# Patient Record
Sex: Male | Born: 1970 | Race: White | Hispanic: No | Marital: Married | Smoking: Current every day smoker
Health system: Southern US, Community
[De-identification: ages and names within clinical notes are randomized; demographics above are authoritative.]

## PROBLEM LIST (undated history)

## (undated) DIAGNOSIS — G4730 Sleep apnea, unspecified: Secondary | ICD-10-CM

## (undated) DIAGNOSIS — I10 Essential (primary) hypertension: Secondary | ICD-10-CM

## (undated) DIAGNOSIS — E785 Hyperlipidemia, unspecified: Secondary | ICD-10-CM

## (undated) DIAGNOSIS — R011 Cardiac murmur, unspecified: Secondary | ICD-10-CM

## (undated) DIAGNOSIS — M1990 Unspecified osteoarthritis, unspecified site: Secondary | ICD-10-CM

## (undated) DIAGNOSIS — K7460 Unspecified cirrhosis of liver: Secondary | ICD-10-CM

## (undated) HISTORY — PX: APPENDECTOMY: SHX54

## (undated) HISTORY — PX: ANTERIOR CRUCIATE LIGAMENT REPAIR: SHX115

## (undated) HISTORY — DX: Hyperlipidemia, unspecified: E78.5

## (undated) HISTORY — DX: Unspecified cirrhosis of liver: K74.60

## (undated) HISTORY — PX: UPPER ENDOSCOPY W/ BANDING: SHX2603

## (undated) HISTORY — PX: KNEE SURGERY: SHX244

## (undated) HISTORY — PX: CHOLECYSTECTOMY: SHX55

---

## 2006-12-17 ENCOUNTER — Ambulatory Visit (HOSPITAL_BASED_OUTPATIENT_CLINIC_OR_DEPARTMENT_OTHER): Admission: RE | Admit: 2006-12-17 | Discharge: 2006-12-17 | Payer: Self-pay | Admitting: Orthopedic Surgery

## 2010-07-10 NOTE — Op Note (Signed)
NAMEOBRIEN, Ray Lewis             ACCOUNT NO.:  [id number]   MEDICAL RECORD NO.:  [id number]          PATIENT TYPE:  AMB   LOCATION:  DSC                          FACILITY:  [facility]   PHYSICIAN:  Feliberto Gottron. Turner Daniels, M.D.   DATE OF BIRTH:  February 20, 1971   DATE OF PROCEDURE:  12/17/2006  DATE OF DISCHARGE:                               OPERATIVE REPORT   PREOPERATIVE DIAGNOSIS:  Right knee chronic ACL tear, acute medial and  lateral meniscal tears.   POSTOPERATIVE DIAGNOSIS:  Right knee chronic ACL tear, acute medial and  lateral meniscal tears.   PROCEDURE:  Right knee arthroscopic debridement of posterior horn medial  meniscal tear posterior horn lateral meniscal tear as well as  chondromalacia of the lateral femoral condyle, focal grade 4 over a 1.5  x 1.5 cm area.  We also performed an endoscopic redo ACL reconstruction  using an allograft, 10 mm wide bone plugs and with an 8 x 20 femoral  screw and a 9 x 25 tibial interference screw.  Both of them Linvatec  Propel titanium screws.   SURGEON:  Feliberto Gottron. Turner Daniels, M.D.   FIRST ASSISTANT:  Skip Mayer PA-C.   ANESTHETIC:  General endotracheal.   ESTIMATED BLOOD LOSS:  Minimal.   FLUID REPLACEMENT:  Liter crystalloid.   DRAINS PLACED:  None.   TOURNIQUET TIME:  None.   INDICATIONS FOR PROCEDURE:  40 year old man who had an ACL  reconstruction done about 10 years ago using an allograft with  bioabsorbable screws.  The graft failed.  He has had some knee  instability and recently was injured at work and sustained a medial  lateral meniscal tears and some possible chondromalacia.  Workers' comp  is going to be covering the meniscal tears and chondromalacia.  The ACL  reconstruction will be covered by his private insurance carrier, I  believe.  The risks and benefits of surgery discussed, questions  answered.  The goal was to stabilize his knee and remove any torn bits  of cartilage that may be interfering with motion in his right  knee.   DESCRIPTION OF PROCEDURE:  The patient identified by armband, taken the  operating room at [facility] day surgery center.  Appropriate anesthetic  monitors were attached and general endotracheal anesthesia induced with  the patient supine position.  Lateral post applied to the table as well  as a Stulberg Mark II foot positioner and the right lower extremity  prepped, draped in usual sterile fashion from the ankle to the midthigh.  Using a #11 blade standard inferomedial and inferolateral peripatellar  portals were then made allowing introduction of the arthroscope through  the inferolateral portal and outflow through the inferomedial portal.  Diagnostic arthroscopy revealed a normal patella and a normal trochlea.  Going to the medial side posterior horn medial meniscal tear was  identified and removed using straight biters, up biters and a 3.5 gator  sucker shaver.  Moving through the notch we documented the ACL being  completely torn and pretty much absent, consistent with it not being  there for at least a few years.  On the lateral  side posterior horn tear  lateral meniscus was identified debrided back to stable margin.  We was  also documented a 1.5 cm section of completely missing cartilage from  the distal and posterior aspects of the lateral femoral condyle.  This  was later drilled with the trephine to bring in a blood supply and scar  cartilage.  The knee was then positioned in 45 degrees flexion in the  Stulberg foot positioner and we performed a superior lateral notchplasty  removing some pretty impressive notch osteophytes with a 4.5 hooded  vortex bur.  We then brought in the Linvatec tibial pin positioning  guide set at 55 degrees, placed the tip of the guide on the posterior  aspect of the ACL footprint and then externally with the guide set at 55  degrees, made a 1.5 cm incision about a centimeter and half medial to  and just distal to the tip of the tibial tubercle.   Through this  incision we passed a guide pin through the flare of the metaphysis of  the tibia and up into the posterior aspect of the ACL footprint and over  reamed it with a 10 mm C reamer from the Linvatec ACL set.  At the back  table a 10 mm wide bone-tendon-bone allograft was fashioned and in the  tibial bone plug a 20-gauge steel wire was placed for traction.  The  patellar bone plug was a 10 mm x 23 mm in length.  Through the tibial  tunnel, we then placed a 7-mm over-the-top guide in the 10:30 position  on the posterior superior aspect of the lateral femoral notch and then  drilled our second guide pin up into the lateral femoral condyle over  reamed this with a 10 mm reamer and unfortunately obtained a good tight  tunnel with no posterior blowout.  Satisfied with the position of the  tunnels, a superior notch was placed in the femoral tunnel lateral notch  in the tibial tunnel with notch cutting guide.  We then took our  allograft and threaded up through the tibial tunnel with the patellar  bone plug going first running the patellar bone plug up into the femoral  tunnel using guide pins as pushers.  A supplemental inferomedial portal  was then made and a nitinol wire threaded up into the notch between the  femoral tunnel and the patellar bone plug.  The knee was hyperflexed to  about 105 degrees and an 8 x 20 Linvatec Propel titanium screw was used  to lock the patellar bone plug into the femoral tunnel and squeaking of  the screw was noted obtaining good firm fixation.  We applied traction  with the 20 gauge wire confirming good fixation of the patellar bone  plug.  The knee was then extended to 45 degrees flexion.  The graft was  rotated to 180 degrees distally using the 20-gauge steel wire and at 45  degrees flexion with a reverse Lachman's maneuver, the graft was locked  into place with a 9 x 25 Linvatec Propel titanium screw obtaining good  firm fixation with a squeak.  Graft  tension was then checked with a  probe.  The knee was flexed to 120 degrees.  We checked tension.  We  brought the knee out to a 0, checked tension again and good tension was  noted and a negative Lachman's test. We did touch up the superior aspect  of the notch plasty a little bit to avoid any impingement, satisfied  with the position of the graft the traction wire was cut and removed and  then using a trephine through the skin with the knee flexed, we  trephined the lateral femoral condyle with a 0.625 K-wire to bring in  some scar cartilage.  At this point the arthroscope was removed.  The  entry point for the graft which was about 1.5 cm in length was closed  with 3-0 Vicryl suture and 4-0 Monocryl suture.  Subcuticular and a  dressing of Xeroform, 4x4 dressing sponges, Webril and Ace wrap applied.  The patient was then awakened and taken to the recovery room without  difficulty and should be noted he did receive a gram of Ancef  preoperatively.      Feliberto Gottron. Turner Daniels, M.D.  Electronically Signed     FJR/MEDQ  D:  12/17/2006  T:  12/18/2006  Job:  [id number]

## 2016-12-03 DIAGNOSIS — Z0000 Encounter for general adult medical examination without abnormal findings: Secondary | ICD-10-CM | POA: Diagnosis not present

## 2017-12-12 DIAGNOSIS — Z6833 Body mass index (BMI) 33.0-33.9, adult: Secondary | ICD-10-CM | POA: Diagnosis not present

## 2017-12-12 DIAGNOSIS — Z0000 Encounter for general adult medical examination without abnormal findings: Secondary | ICD-10-CM | POA: Diagnosis not present

## 2018-03-05 ENCOUNTER — Other Ambulatory Visit: Payer: Self-pay

## 2018-03-05 ENCOUNTER — Emergency Department (HOSPITAL_COMMUNITY)
Admission: EM | Admit: 2018-03-05 | Discharge: 2018-03-05 | Disposition: A | Discharge status: Home / Self Care | Payer: 59 | Attending: Emergency Medicine | Admitting: Emergency Medicine

## 2018-03-05 ENCOUNTER — Encounter (HOSPITAL_COMMUNITY): Payer: Self-pay | Admitting: Emergency Medicine

## 2018-03-05 ENCOUNTER — Emergency Department (HOSPITAL_COMMUNITY): Payer: 59

## 2018-03-05 DIAGNOSIS — R0789 Other chest pain: Secondary | ICD-10-CM | POA: Diagnosis not present

## 2018-03-05 DIAGNOSIS — R9431 Abnormal electrocardiogram [ECG] [EKG]: Secondary | ICD-10-CM | POA: Insufficient documentation

## 2018-03-05 DIAGNOSIS — Z79899 Other long term (current) drug therapy: Secondary | ICD-10-CM | POA: Insufficient documentation

## 2018-03-05 DIAGNOSIS — R079 Chest pain, unspecified: Secondary | ICD-10-CM | POA: Diagnosis not present

## 2018-03-05 DIAGNOSIS — F17210 Nicotine dependence, cigarettes, uncomplicated: Secondary | ICD-10-CM | POA: Insufficient documentation

## 2018-03-05 HISTORY — DX: Sleep apnea, unspecified: G47.30

## 2018-03-05 HISTORY — DX: Cardiac murmur, unspecified: R01.1

## 2018-03-05 HISTORY — DX: Unspecified osteoarthritis, unspecified site: M19.90

## 2018-03-05 LAB — BASIC METABOLIC PANEL
ANION GAP: 10 (ref 5–15)
BUN: 7 mg/dL (ref 6–20)
CALCIUM: 9.1 mg/dL (ref 8.9–10.3)
CO2: 22 mmol/L (ref 22–32)
Chloride: 101 mmol/L (ref 98–111)
Creatinine, Ser: 0.81 mg/dL (ref 0.61–1.24)
GFR calc non Af Amer: >60 mL/min (ref >60)
Glucose, Bld: 123 mg/dL — ABNORMAL HIGH (ref 70–99)
Potassium: 3.8 mmol/L (ref 3.5–5.1)
SODIUM: 133 mmol/L — AB (ref 135–145)

## 2018-03-05 LAB — CBC
HEMATOCRIT: 45.4 % (ref 39.0–52.0)
Hemoglobin: 16.4 g/dL (ref 13.0–17.0)
MCH: 35.3 pg — ABNORMAL HIGH (ref 26.0–34.0)
MCHC: 36.1 g/dL — ABNORMAL HIGH (ref 30.0–36.0)
MCV: 97.8 fL (ref 80.0–100.0)
NRBC: 0 % (ref 0.0–0.2)
Platelets: 126 10*3/uL — ABNORMAL LOW (ref 150–400)
RBC: 4.64 MIL/uL (ref 4.22–5.81)
RDW: 11.6 % (ref 11.5–15.5)
WBC: 8.3 10*3/uL (ref 4.0–10.5)

## 2018-03-05 LAB — I-STAT TROPONIN, ED
Troponin i, poc: 0 ng/mL (ref 0.00–0.08)
Troponin i, poc: 0 ng/mL (ref 0.00–0.08)

## 2018-03-05 NOTE — ED Notes (Signed)
ED Provider at bedside. 

## 2018-03-05 NOTE — ED Triage Notes (Signed)
Pt with non radiating chest tightness with pain and tingling sensation all day. He reports a new onset of dizziness with nausea. Denies sob. A/o at triage.

## 2018-03-05 NOTE — ED Provider Notes (Signed)
[facility] EMERGENCY DEPARTMENT Provider Note   CSN: [id number] Arrival date & time: 03/05/18  1348   History   Chief Complaint Chief Complaint  Patient presents with  . Chest Pain  . Dizziness    HPI Ray Lewis is a 48 y.o. male HLD, sleep apnea, and heart murmur presents with intermittent non radiating left and right sided chest pain onset 7am today. Patient describes pain as tightness and intermittent sharp pains. Patient reports nothing makes the symptoms better or worse. Patient reports associated nausea, dizziness, and lightheadedness. Patient denies DOE, SOB, chest tightness or pressure, radiation to left arm, jaw or back, or diaphoresis. Patient denies prior evaluation by cardiologist. Patient denies a personal or family history of heart problems. Patient denies recent injury, leg edema/pain, recent travel, recent surgery, or hx of DVT/PE. Patient denies weakness, numbness, or paresthesias. Patient reports daily tobacco use and daily alcohol use, but denies drug use.    HPI  Past Medical History:  Diagnosis Date  . Arthritis   . Murmur   . Sleep apnea     There are no active problems to display for this patient.   History reviewed. No pertinent surgical history.      Home Medications    Prior to Admission medications   Medication Sig Start Date End Date Taking? Authorizing Provider  aspirin EC 81 MG tablet Take 81 mg by mouth See admin instructions. Take 81 mg by mouth two to three times a week   Yes [provider]    Family History No family history on file.  Social History Social History   Tobacco Use  . Smoking status: Current Every Day Smoker    Packs/day: 1.00    Types: Cigarettes  . Smokeless tobacco: Never Used  Substance Use Topics  . Alcohol use: Yes  . Drug use: Not Currently     Allergies   Patient has no known allergies.   Review of Systems Review of Systems  Constitutional: Negative for activity  change, appetite change, chills, diaphoresis, fatigue, fever and unexpected weight change.  HENT: Negative for congestion and rhinorrhea.   Respiratory: Negative for cough, chest tightness, shortness of breath and wheezing.   Cardiovascular: Positive for chest pain. Negative for palpitations and leg swelling.  Gastrointestinal: Negative for abdominal pain, nausea and vomiting.  Endocrine: Negative for cold intolerance and heat intolerance.  Musculoskeletal: Negative for back pain.  Skin: Negative for rash.  Allergic/Immunologic: Negative for immunocompromised state.  Neurological: Positive for dizziness and light-headedness. Negative for syncope, weakness, numbness and headaches.  Psychiatric/Behavioral: Negative for agitation and behavioral problems. The patient is not nervous/anxious.      Physical Exam Updated Vital Signs BP (!) 150/80   Pulse 67   Temp 98.2 F (36.8 C) (Oral)   Resp 15   Ht 5\' 9"  (1.753 m)   Wt 106.6 kg   SpO2 96%   BMI 34.70 kg/m   Physical Exam Vitals signs and nursing note reviewed.  Constitutional:      General: He is not in acute distress.    Appearance: He is well-developed. He is not diaphoretic.  HENT:     Head: Normocephalic and atraumatic.  Neck:     Musculoskeletal: Normal range of motion and neck supple.     Vascular: No JVD.  Cardiovascular:     Rate and Rhythm: Normal rate and regular rhythm.     Pulses: Normal pulses.          Radial  pulses are 2+ on the right side and 2+ on the left side.       Dorsalis pedis pulses are 2+ on the right side and 2+ on the left side.     Heart sounds: Murmur present. No friction rub. No gallop.   Pulmonary:     Effort: Pulmonary effort is normal. No respiratory distress.     Breath sounds: Normal breath sounds. No wheezing, rhonchi or rales.  Chest:     Chest wall: No tenderness.  Abdominal:     Palpations: Abdomen is soft.     Tenderness: There is no abdominal tenderness.  Musculoskeletal: Normal  range of motion.     Right lower leg: He exhibits no tenderness. No edema.     Left lower leg: He exhibits no tenderness. No edema.  Skin:    General: Skin is warm.     Capillary Refill: Capillary refill takes less than 2 seconds.     Coloration: Skin is not pale.     Findings: No rash.  Neurological:     Mental Status: He is alert and oriented to person, place, and time.    Mental Status:  Alert, oriented, thought content appropriate, able to give a coherent history. Speech fluent without evidence of aphasia. Able to follow 2 step commands without difficulty.  Cranial Nerves:  II:  Peripheral visual fields grossly normal, pupils equal, round, reactive to light III,IV, VI: ptosis not present, extra-ocular motions intact bilaterally  V,VII: smile symmetric, facial light touch sensation equal VIII: hearing grossly normal to voice  X: uvula elevates symmetrically  XI: bilateral shoulder shrug symmetric and strong XII: midline tongue extension without fassiculations Motor:  Normal tone. 5/5 in upper and lower extremities bilaterally including strong and equal grip strength and dorsiflexion/plantar flexion Sensory: Pinprick and light touch normal in all extremities.  Deep Tendon Reflexes: 2+ and symmetric in the biceps and patella Cerebellar: normal finger-to-nose with bilateral upper extremities Gait: normal gait and balance.  Negative pronator drift. Negative Romberg sign. CV: distal pulses palpable throughout    ED Treatments / Results  Labs (all labs ordered are listed, but only abnormal results are displayed) Labs Reviewed  BASIC METABOLIC PANEL - Abnormal; Notable for the following components:      Result Value   Sodium 133 (*)    Glucose, Bld 123 (*)    All other components within normal limits  CBC - Abnormal; Notable for the following components:   MCH 35.3 (*)    MCHC 36.1 (*)    Platelets 126 (*)    All other components within normal limits  I-STAT TROPONIN, ED    I-STAT TROPONIN, ED    EKG EKG Interpretation  Date/Time:  Thursday March 05 2018 17:57:24 EST Ventricular Rate:  74 PR Interval:  138 QRS Duration: 96 QT Interval:  414 QTC Calculation: 459 R Axis:   63 Text Interpretation:  Normal sinus rhythm Nonspecific T wave abnormality Abnormal ECG unchanged from earlier today Confirmed by Aletta Edouard [id number]) on 03/05/2018 6:04:32 PM   Radiology Dg Chest 2 View  Result Date: 03/05/2018 CLINICAL DATA:  Chest pain. EXAM: CHEST - 2 VIEW COMPARISON:  None. FINDINGS: The heart size and mediastinal contours are within normal limits. Both lungs are clear. No pneumothorax or pleural effusion is noted. The visualized skeletal structures are unremarkable. IMPRESSION: No active cardiopulmonary disease. Electronically Signed   By: Marijo Conception, M.D.   On: 03/05/2018 14:30    Procedures Procedures (including critical care  time)  Medications Ordered in ED Medications - No data to display   Initial Impression / Assessment and Plan / ED Course  I have reviewed the triage vital signs and the nursing notes.  Pertinent labs & imaging results that were available during my care of the patient were reviewed by me and considered in my medical decision making (see chart for details).  Clinical Course as of Mar 05 1829  Thu Mar 05, 2018  1525 No active cardiopulmonary disease.  DG Chest 2 View [AH]  9381 ST and T wave abnormalities in inferior leads.  ED EKG within 10 minutes [AH]  1738 Troponin x 2 negative.   [AH]  0175 With hypertension hypercholesterolemia here with chest pain.  Michela Pitcher he has had pains before but this 1 felt different and concerned him.  His work-up here has been unremarkable with 2 tropes.  He was offered admission but he felt like he could go home and follow-up outpatient with his doctor and we are giving him also the number for cardiology clinic.  He understands to return if any worsening symptoms.   [MB]    Clinical Course  User Index [AH] Arville Lime, PA-C [MB] Hayden Rasmussen, MD   Concern for cardiac etiology of Chest Pain due to history and abnormal EKG. Heart score is a 4. Presentation of chest pain is PERC negative, VSS, no tracheal deviation, no JVD or new murmur, RRR, breath sounds equal bilaterally, negative troponin x2, and negative CXR. Discussed admission with patient for further evaluation and patient refused to be admitted.   I have discussed my concerns as his provider and the possibility that this may worsen. I have specifically discussed that without further evaluation I cannot guarantee there is not a life threatening event occuring.  Pt is A&Ox4, his own POA and states understanding of my concerns and the possible consequences.  I have made pt aware that he may return at any time for further evaluation and treatment.   Patient is to be discharged with recommendation to follow up with Cardiology and PCP in regards to today's hospital visit.   Case has been discussed with and seen by Dr. Melina Copa who agrees with the above plan to discharge.   Final Clinical Impressions(s) / ED Diagnoses   Final diagnoses:  Atypical chest pain  Abnormal finding on EKG    ED Discharge Orders    None       Arville Lime, [place] 03/05/18 1832    Hayden Rasmussen, MD 03/05/18 2320

## 2018-03-05 NOTE — Discharge Instructions (Addendum)
You have been seen today for chest pain. Please read and follow all provided instructions.   1. Medications: usual home medications 2. Treatment: rest, drink plenty of fluids 3. Follow Up: Please follow up with your primary doctor in 2 days for discussion of your diagnoses and further evaluation after today's visit; if you do not have a primary care doctor use the resource guide provided to find one; Please return to the ER for any new or worsening symptoms. Please obtain all of your results from medical records or have your doctors office obtain the results - share them with your doctor - you should be seen at your doctors office. Call today to arrange your follow up.   Take medications as prescribed. Please review all of the medicines and only take them if you do not have an allergy to them. Return to the emergency room for worsening condition or new concerning symptoms. Follow up with your regular doctor. If you don't have a regular doctor use one of the numbers below to establish a primary care doctor. ?  It is also a possibility that you have an allergic reaction to any of the medicines that you have been prescribed - Everybody reacts differently to medications and while MOST people have no trouble with most medicines, you may have a reaction such as nausea, vomiting, rash, swelling, shortness of breath. If this is the case, please stop taking the medicine immediately and contact your physician.  ?  You should return to the ER if you develop severe or worsening symptoms.   Emergency Department Resource Guide 1) Find a Doctor and Pay Out of Pocket Although you won't have to find out who is covered by your insurance plan, it is a good idea to ask around and get recommendations. You will then need to call the office and see if the doctor you have chosen will accept you as a new patient and what types of options they offer for patients who are self-pay. Some doctors offer discounts or will set up  payment plans for their patients who do not have insurance, but you will need to ask so you aren't surprised when you get to your appointment.  2) Contact Your Local Health Department Not all health departments have doctors that can see patients for sick visits, but many do, so it is worth a call to see if yours does. If you don't know where your local health department is, you can check in your phone book. The CDC also has a tool to help you locate your state's health department, and many state websites also have listings of all of their local health departments.  3) Find a [facility] If your illness is not likely to be very severe or complicated, you may want to try a walk in clinic. These are popping up all over the country in pharmacies, drugstores, and shopping centers. They're usually staffed by nurse practitioners or physician assistants that have been trained to treat common illnesses and complaints. They're usually fairly quick and inexpensive. However, if you have serious medical issues or chronic medical problems, these are probably not your best option.  No Primary Care Doctor: Call Health Connect at  [phone] - they can help you locate a primary care doctor that  accepts your insurance, provides certain services, etc. Physician Referral Service- [phone]  Emergency Department Resource Guide 1) Find a Doctor and Pay Out of Pocket Although you won't have to find out who is covered by your insurance  plan, it is a good idea to ask around and get recommendations. You will then need to call the office and see if the doctor you have chosen will accept you as a new patient and what types of options they offer for patients who are self-pay. Some doctors offer discounts or will set up payment plans for their patients who do not have insurance, but you will need to ask so you aren't surprised when you get to your appointment.  2) Contact Your Local Health Department Not all health  departments have doctors that can see patients for sick visits, but many do, so it is worth a call to see if yours does. If you don't know where your local health department is, you can check in your phone book. The CDC also has a tool to help you locate your state's health department, and many state websites also have listings of all of their local health departments.  3) Find a [facility] If your illness is not likely to be very severe or complicated, you may want to try a walk in clinic. These are popping up all over the country in pharmacies, drugstores, and shopping centers. They're usually staffed by nurse practitioners or physician assistants that have been trained to treat common illnesses and complaints. They're usually fairly quick and inexpensive. However, if you have serious medical issues or chronic medical problems, these are probably not your best option.  No Primary Care Doctor: Call Health Connect at  [phone] - they can help you locate a primary care doctor that  accepts your insurance, provides certain services, etc. Physician Referral Service- [phone]  Chronic Pain Problems: Organization         Address  Phone   Notes  [facility]  [phone] Patients need to be referred by their primary care doctor.   Medication Assistance: Organization         Address  Phone   Notes  [facility] Medication [facility] [address] [place], [place] [address] [phone] --Must be a resident of [facility] -- Must have NO insurance coverage whatsoever (no Medicaid/ Medicare, etc.) -- The pt. MUST have a primary care doctor that directs their care regularly and follows them in the community   [facility]  [phone]   [employer]  [phone]    Agencies that provide inexpensive medical care: Organization         Address  Phone   Notes  [place]  [phone]   [facility]    [phone]   [facility] [place],  [address] [phone]   [place] [address] [place] [phone]   [facility]anned Parenthood    [phone]   [facility]    [phone]   [place] [facility] [address], [place] Phone:  [phone], Fax:  [phone] Hours of Operation:  9 am - 6 pm, M-F.  Also accepts Medicaid/Medicare and self-pay.  [facility] for [place], [address], [place] Phone: [phone], Fax: [phone]. Hours of Operation:  8:30 am - 5:30 pm, M-F.  Also accepts Medicaid and self-pay.  [facility] [place] [address], [place] Phone: [phone]   [place], [place], [place] [phone], Ext. [phone] Mondays & Thursdays: 7-9 AM.  First 15 patients are seen on a first come, first serve basis.    [place] Providers:  Organization         Address  Phone   Notes  [facility] [address], [place] [phone] Also accepts self-pay patients.  [facility] [address], [place]  [phone]   [place], [address], [place] [phone]   [facility] [address] [place] [phone]   Lucianne Lei [address], [place]   [phone] Only accepts [place] patients after they have their name applied to their card.   Self-Pay (no insurance) in [facility]:  Organization         Address  Phone   Notes  Sickle Cell Patients, [facility] [place] [phone]   [facility] [place] [phone]   [facility] [place]  [place], [place], [place] [phone]   [facility]/Dr. Osei-Bonsu  [address], [place] or [address], [place] [phone] Phone number for both [place] and [place] locations is the same.  [facility] [address] [place] [phone]   [facility] [address] [place] or [address] [phone]   [facility] [address], [place] [phone], phone; [phone], fax Sees patients 1st and 3rd Saturday of every month.  Must not qualify for public or private insurance (i.e. Medicaid, Medicare, [place] Health Choice, Veterans' Benefits)  Household income should be no more than 200% of the poverty level The clinic cannot treat you if you are pregnant or think you are pregnant  Sexually transmitted diseases are not treated at the clinic.

## 2018-03-05 NOTE — ED Notes (Signed)
Patient verbalizes understanding of discharge instructions. Opportunity for questioning and answers were provided. Armband removed by staff, pt discharged from ED. Pt ambulatory to lobby and gone home with family.

## 2018-03-09 DIAGNOSIS — R0789 Other chest pain: Secondary | ICD-10-CM | POA: Diagnosis not present

## 2018-03-09 DIAGNOSIS — F17200 Nicotine dependence, unspecified, uncomplicated: Secondary | ICD-10-CM | POA: Diagnosis not present

## 2018-03-09 DIAGNOSIS — Z6832 Body mass index (BMI) 32.0-32.9, adult: Secondary | ICD-10-CM | POA: Diagnosis not present

## 2018-03-10 ENCOUNTER — Encounter: Payer: Self-pay | Admitting: Cardiology

## 2018-03-10 ENCOUNTER — Ambulatory Visit (INDEPENDENT_AMBULATORY_CARE_PROVIDER_SITE_OTHER): Payer: 59 | Admitting: Cardiology

## 2018-03-10 DIAGNOSIS — IMO0001 Reserved for inherently not codable concepts without codable children: Secondary | ICD-10-CM | POA: Insufficient documentation

## 2018-03-10 DIAGNOSIS — F17200 Nicotine dependence, unspecified, uncomplicated: Secondary | ICD-10-CM | POA: Diagnosis not present

## 2018-03-10 DIAGNOSIS — I10 Essential (primary) hypertension: Secondary | ICD-10-CM | POA: Diagnosis not present

## 2018-03-10 DIAGNOSIS — E785 Hyperlipidemia, unspecified: Secondary | ICD-10-CM | POA: Insufficient documentation

## 2018-03-10 DIAGNOSIS — R0789 Other chest pain: Secondary | ICD-10-CM | POA: Diagnosis not present

## 2018-03-10 MED ORDER — METOPROLOL SUCCINATE ER 50 MG PO TB24: 50.0000 mg | ORAL_TABLET | Freq: Every day | ORAL | 1 refills | Status: DC

## 2018-03-10 NOTE — Patient Instructions (Signed)
Medication Instructions:  Your physician has recommended you make the following change in your medication:   Stop: Atenolol  Start: Toporol xl 50 mg daily  If you need a refill on your cardiac medications before your next appointment, please call your pharmacy.   Lab work: None.  If you have labs (blood work) drawn today and your tests are completely normal, you will receive your results only by: Marland Kitchen MyChart Message (if you have MyChart) OR . A paper copy in the mail If you have any lab test that is abnormal or we need to change your treatment, we will call you to review the results.  Testing/Procedures: Your physician has requested that you have an echocardiogram. Echocardiography is a painless test that uses sound waves to create images of your heart. It provides your doctor with information about the size and shape of your heart and how well your heart's chambers and valves are working. This procedure takes approximately one hour. There are no restrictions for this procedure.  Your physician has requested that you have a lexiscan myoview. For further information please visit [email or web address]. Please follow instruction sheet, as given.    Follow-Up: At [facility], you and your health needs are our priority.  As part of our continuing mission to provide you with exceptional heart care, we have created designated Provider Care Teams.  These Care Teams include your primary Cardiologist (physician) and Advanced Practice Providers (APPs -  Physician Assistants and Nurse Practitioners) who all work together to provide you with the care you need, when you need it. You will need a follow up appointment in 1 months.  Please call our office 2 months in advance to schedule this appointment.  You may see No primary care provider on file. or another member of our [employer] Provider Team in [place]: Shirlee More, MD . Jyl Heinz, MD  Any Other Special Instructions Will Be Listed  Below (If Applicable).   Metoprolol extended-release tablets What is this medicine? METOPROLOL (me TOE proe lole) is a beta-blocker. Beta-blockers reduce the workload on the heart and help it to beat more regularly. This medicine is used to treat high blood pressure and to prevent chest pain. It is also used to after a heart attack and to prevent an additional heart attack from occurring. This medicine may be used for other purposes; ask your health care provider or pharmacist if you have questions. COMMON BRAND NAME(S): toprol, Toprol XL What should I tell my health care provider before I take this medicine? They need to know if you have any of these conditions: -diabetes -heart or vessel disease like slow heart rate, worsening heart failure, heart block, sick sinus syndrome or Raynaud's disease -kidney disease -liver disease -lung or breathing disease, like asthma or emphysema -pheochromocytoma -thyroid disease -an unusual or allergic reaction to metoprolol, other beta-blockers, medicines, foods, dyes, or preservatives -pregnant or trying to get pregnant -breast-feeding How should I use this medicine? Take this medicine by mouth with a glass of water. Follow the directions on the prescription label. Do not crush or chew. Take this medicine with or immediately after meals. Take your doses at regular intervals. Do not take more medicine than directed. Do not stop taking this medicine suddenly. This could lead to serious heart-related effects. Talk to your pediatrician regarding the use of this medicine in children. While this drug may be prescribed for children as young as 6 years for selected conditions, precautions do apply. Overdosage: If you think you  have taken too much of this medicine contact a poison control center or emergency room at once. NOTE: This medicine is only for you. Do not share this medicine with others. What if I miss a dose? If you miss a dose, take it as soon as you  can. If it is almost time for your next dose, take only that dose. Do not take double or extra doses. What may interact with this medicine? This medicine may interact with the following medications: -certain medicines for blood pressure, heart disease, irregular heart beat -certain medicines for depression, like monoamine oxidase (MAO) inhibitors, fluoxetine, or paroxetine -clonidine -dobutamine -epinephrine -isoproterenol -reserpine This list may not describe all possible interactions. Give your health care provider a list of all the medicines, herbs, non-prescription drugs, or dietary supplements you use. Also tell them if you smoke, drink alcohol, or use illegal drugs. Some items may interact with your medicine. What should I watch for while using this medicine? Visit your doctor or health care professional for regular check ups. Contact your doctor right away if your symptoms worsen. Check your blood pressure and pulse rate regularly. Ask your health care professional what your blood pressure and pulse rate should be, and when you should contact them. You may get drowsy or dizzy. Do not drive, use machinery, or do anything that needs mental alertness until you know how this medicine affects you. Do not sit or stand up quickly, especially if you are an older patient. This reduces the risk of dizzy or fainting spells. Contact your doctor if these symptoms continue. Alcohol may interfere with the effect of this medicine. Avoid alcoholic drinks. What side effects may I notice from receiving this medicine? Side effects that you should report to your doctor or health care professional as soon as possible: -allergic reactions like skin rash, itching or hives -cold or numb hands or feet -depression -difficulty breathing -faint -fever with sore throat -irregular heartbeat, chest pain -rapid weight gain -swollen legs or ankles Side effects that usually do not require medical attention (report to  your doctor or health care professional if they continue or are bothersome): -anxiety or nervousness -change in sex drive or performance -dry skin -headache -nightmares or trouble sleeping -short term memory loss -stomach upset or diarrhea -unusually tired This list may not describe all possible side effects. Call your doctor for medical advice about side effects. You may report side effects to FDA at 1-800-FDA-1088. Where should I keep my medicine? Keep out of the reach of children. Store at room temperature between 15 and 30 degrees C (59 and 86 degrees F). Throw away any unused medicine after the expiration date. NOTE: This sheet is a summary. It may not cover all possible information. If you have questions about this medicine, talk to your doctor, pharmacist, or health care provider.  2019 Elsevier/Gold Standard (2012-10-16 14:41:37)   Cardiac Nuclear Scan A cardiac nuclear scan is a test that measures blood flow to the heart when a person is resting and when he or she is exercising. The test looks for problems such as:  Not enough blood reaching a portion of the heart.  The heart muscle not working normally. You may need this test if:  You have heart disease.  You have had abnormal lab results.  You have had heart surgery or a balloon procedure to open up blocked arteries (angioplasty).  You have chest pain.  You have shortness of breath. In this test, a radioactive dye (tracer) is injected  into your bloodstream. After the tracer has traveled to your heart, an imaging device is used to measure how much of the tracer is absorbed by or distributed to various areas of your heart. This procedure is usually done at a hospital and takes 2-4 hours. Tell a health care provider about:  Any allergies you have.  All medicines you are taking, including vitamins, herbs, eye drops, creams, and over-the-counter medicines.  Any problems you or family members have had with anesthetic  medicines.  Any blood disorders you have.  Any surgeries you have had.  Any medical conditions you have.  Whether you are pregnant or may be pregnant. What are the risks? Generally, this is a safe procedure. However, problems may occur, including:  Serious chest pain and heart attack. This is only a risk if the stress portion of the test is done.  Rapid heartbeat.  Sensation of warmth in your chest. This usually passes quickly.  Allergic reaction to the tracer. What happens before the procedure?  Ask your health care provider about changing or stopping your regular medicines. This is especially important if you are taking diabetes medicines or blood thinners.  Follow instructions from your health care provider about eating or drinking restrictions.  Remove your jewelry on the day of the procedure. What happens during the procedure?  An IV will be inserted into one of your veins.  Your health care provider will inject a small amount of radioactive tracer through the IV.  You will wait for 20-40 minutes while the tracer travels through your bloodstream.  Your heart activity will be monitored with an electrocardiogram (ECG).  You will lie down on an exam table.  Images of your heart will be taken for about 15-20 minutes.  You may also have a stress test. For this test, one of the following may be done: ? You will exercise on a treadmill or stationary bike. While you exercise, your heart's activity will be monitored with an ECG, and your blood pressure will be checked. ? You will be given medicines that will increase blood flow to parts of your heart. This is done if you are unable to exercise.  When blood flow to your heart has peaked, a tracer will again be injected through the IV.  After 20-40 minutes, you will get back on the exam table and have more images taken of your heart.  Depending on the type of tracer used, scans may need to be repeated 3-4 hours  later.  Your IV line will be removed when the procedure is over. The procedure may vary among health care providers and hospitals. What happens after the procedure?  Unless your health care provider tells you otherwise, you may return to your normal schedule, including diet, activities, and medicines.  Unless your health care provider tells you otherwise, you may increase your fluid intake. This will help to flush the contrast dye from your body. Drink enough fluid to keep your urine pale yellow.  Ask your health care provider, or the department that is doing the test: ? When will my results be ready? ? How will I get my results? Summary  A cardiac nuclear scan measures the blood flow to the heart when a person is resting and when he or she is exercising.  Tell your health care provider if you are pregnant.  Before the procedure, ask your health care provider about changing or stopping your regular medicines. This is especially important if you are taking diabetes medicines  or blood thinners.  After the procedure, unless your health care provider tells you otherwise, increase your fluid intake. This will help flush the contrast dye from your body.  After the procedure, unless your health care provider tells you otherwise, you may return to your normal schedule, including diet, activities, and medicines. This information is not intended to replace advice given to you by your health care provider. Make sure you discuss any questions you have with your health care provider. Document Released: 03/08/2004 Document Revised: 07/28/2017 Document Reviewed: 07/28/2017 Elsevier Interactive Patient Education  2019 [employer].    Echocardiogram An echocardiogram is a procedure that uses painless sound waves (ultrasound) to produce an image of the heart. Images from an echocardiogram can provide important information about:  Signs of coronary artery disease (CAD).  Aneurysm detection. An  aneurysm is a weak or damaged part of an artery wall that bulges out from the normal force of blood pumping through the body.  Heart size and shape. Changes in the size or shape of the heart can be associated with certain conditions, including heart failure, aneurysm, and CAD.  Heart muscle function.  Heart valve function.  Signs of a past heart attack.  Fluid buildup around the heart.  Thickening of the heart muscle.  A tumor or infectious growth around the heart valves. Tell a health care provider about:  Any allergies you have.  All medicines you are taking, including vitamins, herbs, eye drops, creams, and over-the-counter medicines.  Any blood disorders you have.  Any surgeries you have had.  Any medical conditions you have.  Whether you are pregnant or may be pregnant. What are the risks? Generally, this is a safe procedure. However, problems may occur, including:  Allergic reaction to dye (contrast) that may be used during the procedure. What happens before the procedure? No specific preparation is needed. You may eat and drink normally. What happens during the procedure?   An IV tube may be inserted into one of your veins.  You may receive contrast through this tube. A contrast is an injection that improves the quality of the pictures from your heart.  A gel will be applied to your chest.  A wand-like tool (transducer) will be moved over your chest. The gel will help to transmit the sound waves from the transducer.  The sound waves will harmlessly bounce off of your heart to allow the heart images to be captured in real-time motion. The images will be recorded on a computer. The procedure may vary among health care providers and hospitals. What happens after the procedure?  You may return to your normal, everyday life, including diet, activities, and medicines, unless your health care provider tells you not to do that. Summary  An echocardiogram is a  procedure that uses painless sound waves (ultrasound) to produce an image of the heart.  Images from an echocardiogram can provide important information about the size and shape of your heart, heart muscle function, heart valve function, and fluid buildup around your heart.  You do not need to do anything to prepare before this procedure. You may eat and drink normally.  After the echocardiogram is completed, you may return to your normal, everyday life, unless your health care provider tells you not to do that. This information is not intended to replace advice given to you by your health care provider. Make sure you discuss any questions you have with your health care provider. Document Released: 02/09/2000 Document Revised: 03/16/2016 Document Reviewed:  03/16/2016 Elsevier Interactive Patient Education  [employer].

## 2018-03-10 NOTE — Progress Notes (Signed)
Cardiology Consultation:    Date:  03/10/2018   ID:  SEAN MALINOWSKI, DOB [birth year], MRN [id number]  PCP:  Angelina Sheriff, MD  Cardiologist:  Jenne Campus, MD   Referring MD: Angelina Sheriff, MD   Chief Complaint  Patient presents with  . Follow up from ER  However chest pain  History of Present Illness:    Ray Lewis is a 48 y.o. male who is being seen today for the evaluation of chest pain at the request of Jacqlyn Larsen II, MD.  He does not have past medical history recently he was recommended to have hypertension dyslipidemia.  He is a smoker as well as alcohol drinker he was referred to me because of chest pain apparently few days ago he got up in the morning he drank first dose of apple cider vinegar and the purpose of this was to help with his cholesterol but he knows he got elevated she left to call me he went to his car started driving when he got into the destination he left the car started walking started feeling some tingling in his chest as well as some sharp sensation in the chest also became slightly dizzy however he was able to continue working for another few hours after that he went back to his car and again the same sensation happened except stabbing like sensation the chest was much more severe he ended up going to the emergency room where he previously evaluated.  He did not have myocardial infarction he was sent home yesterday he saw his primary care physician who contacted me and sent him for me to be evaluated.  He denies having chest pain like this before he does have multiple risk factors for coronary artery disease that include family history, recently recognized hypertension, dyslipidemia with low HDL, smoking as well as drinking.  Past Medical History:  Diagnosis Date  . Arthritis   . Hyperlipidemia   . Murmur   . Sleep apnea     Past Surgical History:  Procedure Laterality Date  . ANTERIOR CRUCIATE LIGAMENT REPAIR Right   .  APPENDECTOMY    . KNEE SURGERY Right    x2    Current Medications: Current Meds  Medication Sig  . aspirin EC 81 MG tablet Take 81 mg by mouth See admin instructions. Take 81 mg by mouth two to three times a week  . atenolol (TENORMIN) 50 MG tablet Take 50 mg by mouth daily.  . rosuvastatin (CRESTOR) 5 MG tablet Take 5 mg by mouth daily.     Allergies:   Patient has no known allergies.   Social History   Socioeconomic History  . Marital status: Married    Spouse name: Not on file  . Number of children: Not on file  . Years of education: Not on file  . Highest education level: Not on file  Occupational History  . Not on file  Social Needs  . Financial resource strain: Not on file  . Food insecurity:    Worry: Not on file    Inability: Not on file  . Transportation needs:    Medical: Not on file    Non-medical: Not on file  Tobacco Use  . Smoking status: Current Every Day Smoker    Packs/day: 1.00    Types: Cigarettes  . Smokeless tobacco: Never Used  Substance and Sexual Activity  . Alcohol use: Yes  . Drug use: Not Currently  . Sexual activity:  Not on file  Lifestyle  . Physical activity:    Days per week: Not on file    Minutes per session: Not on file  . Stress: Not on file  Relationships  . Social connections:    Talks on phone: Not on file    Gets together: Not on file    Attends religious service: Not on file    Active member of club or organization: Not on file    Attends meetings of clubs or organizations: Not on file    Relationship status: Not on file  Other Topics Concern  . Not on file  Social History Narrative  . Not on file     Family History: The patient's family history includes Cancer in his mother. ROS:   Please see the history of present illness.    All 14 point review of systems negative except as described per history of present illness.  EKGs/Labs/Other Studies Reviewed:    The following studies were reviewed today: Normal  sinus rhythm normal P interval normal QS complex duration morphology nonspecific ST segment changes  Recent Labs: 03/05/2018: BUN 7; Creatinine, Ser 0.81; Hemoglobin 16.4; Platelets 126; Potassium 3.8; Sodium 133  Recent Lipid Panel No results found for: CHOL, TRIG, HDL, CHOLHDL, VLDL, LDLCALC, LDLDIRECT  Physical Exam:    VS:  BP 140/70   Pulse 67   Ht 5\' 9"  (1.753 m)   Wt 231 lb 1.9 oz (104.8 kg)   SpO2 96%   BMI 34.13 kg/m     Wt Readings from Last 3 Encounters:  03/10/18 231 lb 1.9 oz (104.8 kg)  03/05/18 235 lb (106.6 kg)     GEN:  Well nourished, well developed in no acute distress HEENT: Normal NECK: No JVD; No carotid bruits LYMPHATICS: No lymphadenopathy CARDIAC: RRR, no murmurs, no rubs, no gallops RESPIRATORY:  Clear to auscultation without rales, wheezing or rhonchi  ABDOMEN: Soft, non-tender, non-distended MUSCULOSKELETAL:  No edema; No deformity  SKIN: Warm and dry NEUROLOGIC:  Alert and oriented x 3 PSYCHIATRIC:  Normal affect   ASSESSMENT:    1. Atypical chest pain   2. Smoking   3. Dyslipidemia   4. Essential hypertension    PLAN:    In order of problems listed above:  1. Atypical chest pain however multiple risk factors for coronary artery disease.  Pain is sharp pain is stabbing-like (lasting only for less than a minute not related to exercise but still considering the fact that he does have multiple risk factors for coronary artery disease I think it would be reasonable to perform stress test.  He does have chronic right knee injury including 2 ACL replacement therefore I think Lexiscan will be the best approach to his scenario.  In the meantime he is taking aspirin I will also put him on the Toprol 25 twice daily. 2. Dyslipidemia he is started on Crestor 5 mg daily which I will continue. 3. Essential hypertension hopefully will be well controlled beta-blocker 4. Smoking we had a long discussion and strongly recommended to quit   Medication  Adjustments/Labs and Tests Ordered: Current medicines are reviewed at length with the patient today.  Concerns regarding medicines are outlined above.  No orders of the defined types were placed in this encounter.  No orders of the defined types were placed in this encounter.   Signed, Park Liter, MD, [facility]. 03/10/2018 2:31 PM    [place]

## 2018-03-18 ENCOUNTER — Telehealth: Payer: Self-pay | Admitting: *Deleted

## 2018-03-18 NOTE — Telephone Encounter (Signed)
Pt called to let us know he will be getting his echo and stress test through the [place] since it is covered with them. Pt will contact [facility] about where he needs to go to get that done.

## 2018-03-19 ENCOUNTER — Ambulatory Visit (HOSPITAL_BASED_OUTPATIENT_CLINIC_OR_DEPARTMENT_OTHER): Payer: 59

## 2018-04-14 ENCOUNTER — Ambulatory Visit: Payer: 59 | Admitting: Cardiology

## 2018-05-04 DIAGNOSIS — J029 Acute pharyngitis, unspecified: Secondary | ICD-10-CM | POA: Diagnosis not present

## 2018-05-04 DIAGNOSIS — I10 Essential (primary) hypertension: Secondary | ICD-10-CM | POA: Diagnosis not present

## 2019-03-01 ENCOUNTER — Encounter (HOSPITAL_COMMUNITY): Payer: Self-pay | Admitting: *Deleted

## 2019-03-01 ENCOUNTER — Emergency Department (HOSPITAL_COMMUNITY): Payer: BC Managed Care – PPO

## 2019-03-01 ENCOUNTER — Other Ambulatory Visit: Payer: Self-pay

## 2019-03-01 ENCOUNTER — Emergency Department (HOSPITAL_COMMUNITY)
Admission: EM | Admit: 2019-03-01 | Discharge: 2019-03-02 | Disposition: A | Discharge status: Home / Self Care | Payer: BC Managed Care – PPO | Attending: Emergency Medicine | Admitting: Emergency Medicine

## 2019-03-01 DIAGNOSIS — R079 Chest pain, unspecified: Secondary | ICD-10-CM

## 2019-03-01 DIAGNOSIS — I10 Essential (primary) hypertension: Secondary | ICD-10-CM | POA: Diagnosis not present

## 2019-03-01 DIAGNOSIS — R0789 Other chest pain: Secondary | ICD-10-CM | POA: Insufficient documentation

## 2019-03-01 DIAGNOSIS — Z7982 Long term (current) use of aspirin: Secondary | ICD-10-CM | POA: Diagnosis not present

## 2019-03-01 DIAGNOSIS — R0602 Shortness of breath: Secondary | ICD-10-CM | POA: Diagnosis not present

## 2019-03-01 DIAGNOSIS — Z79899 Other long term (current) drug therapy: Secondary | ICD-10-CM | POA: Diagnosis not present

## 2019-03-01 DIAGNOSIS — F17210 Nicotine dependence, cigarettes, uncomplicated: Secondary | ICD-10-CM | POA: Diagnosis not present

## 2019-03-01 DIAGNOSIS — I499 Cardiac arrhythmia, unspecified: Secondary | ICD-10-CM | POA: Diagnosis not present

## 2019-03-01 HISTORY — DX: Essential (primary) hypertension: I10

## 2019-03-01 LAB — CBC
HCT: 43 % (ref 39.0–52.0)
Hemoglobin: 14.7 g/dL (ref 13.0–17.0)
MCH: 35.2 pg — ABNORMAL HIGH (ref 26.0–34.0)
MCHC: 34.2 g/dL (ref 30.0–36.0)
MCV: 102.9 fL — ABNORMAL HIGH (ref 80.0–100.0)
Platelets: UNDETERMINED 10*3/uL (ref 150–400)
RBC: 4.18 MIL/uL — ABNORMAL LOW (ref 4.22–5.81)
RDW: 11.9 % (ref 11.5–15.5)
WBC: 10.8 10*3/uL — ABNORMAL HIGH (ref 4.0–10.5)
nRBC: 0 % (ref 0.0–0.2)

## 2019-03-01 LAB — BASIC METABOLIC PANEL
Anion gap: 11 (ref 5–15)
BUN: 5 mg/dL — ABNORMAL LOW (ref 6–20)
CO2: 23 mmol/L (ref 22–32)
Calcium: 9.6 mg/dL (ref 8.9–10.3)
Chloride: 98 mmol/L (ref 98–111)
Creatinine, Ser: 0.73 mg/dL (ref 0.61–1.24)
GFR calc Af Amer: >60 mL/min (ref >60)
GFR calc non Af Amer: >60 mL/min (ref >60)
Glucose, Bld: 165 mg/dL — ABNORMAL HIGH (ref 70–99)
Potassium: 4.8 mmol/L (ref 3.5–5.1)
Sodium: 132 mmol/L — ABNORMAL LOW (ref 135–145)

## 2019-03-01 LAB — TROPONIN I (HIGH SENSITIVITY): Troponin I (High Sensitivity): 8 ng/L (ref <18)

## 2019-03-01 MED ORDER — SODIUM CHLORIDE 0.9% FLUSH: 3.0000 mL | Freq: Once | INTRAVENOUS | Status: DC

## 2019-03-01 NOTE — ED Triage Notes (Signed)
Pt here via [facility] from work.  States was walking upstairs and began having R sided chest pain that radiated to R shoulder and sob.  Given 1 sl nitro and 324 asa with minimal relief.

## 2019-03-02 LAB — TROPONIN I (HIGH SENSITIVITY): Troponin I (High Sensitivity): 10 ng/L (ref <18)

## 2019-03-02 NOTE — ED Provider Notes (Signed)
[facility] EMERGENCY DEPARTMENT Provider Note   CSN: [id number] Arrival date & time: 03/01/19  1326     History Chief Complaint  Patient presents with  . Chest Pain    Ray Lewis is a 49 y.o. male.  Patient is a 49 year old male with history of hypertension, hyperlipidemia, tobacco use.  He presents today for evaluation of chest discomfort.  Patient states that he was walking up a flight of stairs this morning when he developed a tightness to the right upper chest with associated dizziness and shortness of breath.  This occurred while he was at work.  Patient was transported here by EMS.  He was given sublingual nitro and aspirin prior to arrival with little relief.  His symptoms resolved after prolonged wait in the waiting room.  He denies any prior cardiac history, but does have risk factors of cholesterol, hypertension, tobacco use.  The history is provided by the patient.  Chest Pain Pain location:  R chest Pain quality: tightness   Pain radiates to:  Does not radiate Pain severity:  Moderate Onset quality:  Sudden Timing:  Constant Progression:  Resolved Chronicity:  New Relieved by:  Nothing Worsened by:  Nothing      Past Medical History:  Diagnosis Date  . Arthritis   . Hyperlipidemia   . Hypertension   . Murmur   . Sleep apnea     Patient Active Problem List   Diagnosis Date Noted  . Atypical chest pain 03/10/2018  . Smoking 03/10/2018  . Dyslipidemia 03/10/2018  . Essential hypertension 03/10/2018    Past Surgical History:  Procedure Laterality Date  . ANTERIOR CRUCIATE LIGAMENT REPAIR Right   . APPENDECTOMY    . KNEE SURGERY Right    x2       Family History  Problem Relation Age of Onset  . Cancer Mother     Social History   Tobacco Use  . Smoking status: Current Every Day Smoker    Packs/day: 1.00    Types: Cigarettes  . Smokeless tobacco: Never Used  Substance Use Topics  . Alcohol use: Yes   Alcohol/week: 2.0 standard drinks    Types: 2 Cans of beer per week  . Drug use: Not Currently    Home Medications Prior to Admission medications   Medication Sig Start Date End Date Taking? Authorizing Provider  aspirin EC 81 MG tablet Take 81 mg by mouth See admin instructions. Take 81 mg by mouth two to three times a week    [provider]  metoprolol succinate (TOPROL-XL) 50 MG 24 hr tablet Take 1 tablet (50 mg total) by mouth daily. Take with or immediately following a meal. 03/10/18 06/08/18  Park Liter, MD  rosuvastatin (CRESTOR) 5 MG tablet Take 5 mg by mouth daily.    [provider]    Allergies    Patient has no known allergies.  Review of Systems   Review of Systems  Cardiovascular: Positive for chest pain.  All other systems reviewed and are negative.   Physical Exam Updated Vital Signs BP (!) 144/66   Pulse (!) 55   Temp 98.3 F (36.8 C) (Oral)   Resp 12   Ht 5\' 9"  (1.753 m)   Wt 114.8 kg   SpO2 100%   BMI 37.36 kg/m   Physical Exam Vitals and nursing note reviewed.  Constitutional:      General: He is not in acute distress.    Appearance: He is well-developed.  He is not diaphoretic.  HENT:     Head: Normocephalic and atraumatic.  Cardiovascular:     Rate and Rhythm: Normal rate and regular rhythm.     Heart sounds: No murmur. No friction rub.  Pulmonary:     Effort: Pulmonary effort is normal. No respiratory distress.     Breath sounds: Normal breath sounds. No wheezing or rales.  Abdominal:     General: Bowel sounds are normal. There is no distension.     Palpations: Abdomen is soft.     Tenderness: There is no abdominal tenderness.  Musculoskeletal:        General: Normal range of motion.     Cervical back: Normal range of motion and neck supple.     Right lower leg: No tenderness. No edema.     Left lower leg: No tenderness. No edema.  Skin:    General: Skin is warm and dry.  Neurological:     Mental Status: He is  alert and oriented to person, place, and time.     Coordination: Coordination normal.     ED Results / Procedures / Treatments   Labs (all labs ordered are listed, but only abnormal results are displayed) Labs Reviewed  BASIC METABOLIC PANEL - Abnormal; Notable for the following components:      Result Value   Sodium 132 (*)    Glucose, Bld 165 (*)    BUN 5 (*)    All other components within normal limits  CBC - Abnormal; Notable for the following components:   WBC 10.8 (*)    RBC 4.18 (*)    MCV 102.9 (*)    MCH 35.2 (*)    All other components within normal limits  TROPONIN I (HIGH SENSITIVITY)  TROPONIN I (HIGH SENSITIVITY)    EKG EKG Interpretation  Date/Time:  Monday March 01 2019 13:47:33 EST Ventricular Rate:  69 PR Interval:  142 QRS Duration: 92 QT Interval:  414 QTC Calculation: 443 R Axis:   69 Text Interpretation: Sinus rhythm with Premature atrial complexes Otherwise normal ECG Confirmed by Veryl Speak [phone]) on 03/01/2019 11:58:27 PM   Radiology DG Chest 2 View  Result Date: 03/01/2019 CLINICAL DATA:  Acute onset right-sided chest pain and shortness of breath. EXAM: CHEST - 2 VIEW COMPARISON:  03/05/2018 FINDINGS: The heart size and mediastinal contours are within normal limits. Both lungs are clear. The visualized skeletal structures are unremarkable. IMPRESSION: No active cardiopulmonary disease. Electronically Signed   By: Marlaine Hind M.D.   On: 03/01/2019 14:24    Procedures Procedures (including critical care time)  Medications Ordered in ED Medications  sodium chloride flush (NS) 0.9 % injection 3 mL (has no administration in time range)    ED Course  I have reviewed the triage vital signs and the nursing notes.  Pertinent labs & imaging results that were available during my care of the patient were reviewed by me and considered in my medical decision making (see chart for details).    MDM Rules/Calculators/A&P  Patient presenting  with complaints of chest discomfort.  This began this morning while he was at work walking up a flight of stairs.  Patient's work-up thus far is unremarkable.  His EKG is unchanged and troponin x2 8 hours apart is negative.  Patient is feeling better and at this point I feel as though he is appropriate for discharge.  I doubt a cardiac etiology, but feel as though cardiology follow up for stress testing is appropriate.  Patient to return to the ER in the meantime if symptoms worsen or change.  Final Clinical Impression(s) / ED Diagnoses Final diagnoses:  None    Rx / DC Orders ED Discharge Orders    None       Veryl Speak, MD 03/02/19 [phone]

## 2019-03-02 NOTE — Discharge Instructions (Addendum)
Follow-up with cardiology in the next few days.  The contact information for the [facility] health cardiology clinic on [facility] has been provided in this discharge summary for you to call and make these arrangements.  Return to the emergency department in the meantime if your symptoms significantly worsen or change.

## 2019-09-19 DIAGNOSIS — J90 Pleural effusion, not elsewhere classified: Secondary | ICD-10-CM | POA: Diagnosis not present

## 2019-09-19 DIAGNOSIS — M489 Spondylopathy, unspecified: Secondary | ICD-10-CM | POA: Diagnosis not present

## 2019-09-19 DIAGNOSIS — R072 Precordial pain: Secondary | ICD-10-CM | POA: Diagnosis not present

## 2019-09-19 DIAGNOSIS — R0609 Other forms of dyspnea: Secondary | ICD-10-CM | POA: Diagnosis not present

## 2019-09-19 DIAGNOSIS — I499 Cardiac arrhythmia, unspecified: Secondary | ICD-10-CM | POA: Diagnosis not present

## 2019-09-19 DIAGNOSIS — R0789 Other chest pain: Secondary | ICD-10-CM | POA: Diagnosis not present

## 2019-09-19 DIAGNOSIS — I491 Atrial premature depolarization: Secondary | ICD-10-CM | POA: Diagnosis not present

## 2019-09-19 DIAGNOSIS — R011 Cardiac murmur, unspecified: Secondary | ICD-10-CM | POA: Diagnosis not present

## 2019-09-19 DIAGNOSIS — F17210 Nicotine dependence, cigarettes, uncomplicated: Secondary | ICD-10-CM | POA: Diagnosis not present

## 2019-09-19 DIAGNOSIS — F419 Anxiety disorder, unspecified: Secondary | ICD-10-CM | POA: Diagnosis not present

## 2019-09-19 DIAGNOSIS — R0602 Shortness of breath: Secondary | ICD-10-CM | POA: Diagnosis not present

## 2019-09-19 DIAGNOSIS — E785 Hyperlipidemia, unspecified: Secondary | ICD-10-CM | POA: Diagnosis not present

## 2019-09-19 DIAGNOSIS — R222 Localized swelling, mass and lump, trunk: Secondary | ICD-10-CM | POA: Diagnosis not present

## 2019-09-19 DIAGNOSIS — E7800 Pure hypercholesterolemia, unspecified: Secondary | ICD-10-CM | POA: Diagnosis not present

## 2019-09-19 DIAGNOSIS — Z79899 Other long term (current) drug therapy: Secondary | ICD-10-CM | POA: Diagnosis not present

## 2019-09-19 DIAGNOSIS — Z7982 Long term (current) use of aspirin: Secondary | ICD-10-CM | POA: Diagnosis not present

## 2019-09-19 DIAGNOSIS — F17200 Nicotine dependence, unspecified, uncomplicated: Secondary | ICD-10-CM | POA: Diagnosis not present

## 2019-09-19 DIAGNOSIS — I10 Essential (primary) hypertension: Secondary | ICD-10-CM | POA: Diagnosis not present

## 2019-09-19 DIAGNOSIS — R079 Chest pain, unspecified: Secondary | ICD-10-CM | POA: Diagnosis not present

## 2019-09-19 DIAGNOSIS — I459 Conduction disorder, unspecified: Secondary | ICD-10-CM | POA: Diagnosis not present

## 2019-09-27 DIAGNOSIS — I10 Essential (primary) hypertension: Secondary | ICD-10-CM | POA: Diagnosis not present

## 2019-09-27 DIAGNOSIS — Z6832 Body mass index (BMI) 32.0-32.9, adult: Secondary | ICD-10-CM | POA: Diagnosis not present

## 2019-09-27 DIAGNOSIS — R079 Chest pain, unspecified: Secondary | ICD-10-CM | POA: Diagnosis not present

## 2019-09-27 DIAGNOSIS — R222 Localized swelling, mass and lump, trunk: Secondary | ICD-10-CM | POA: Diagnosis not present

## 2019-09-29 IMAGING — CR DG CHEST 2V
2 series · 2 of 2 positions shown · non-contrast
Comparison: None.

CLINICAL DATA: Chest pain.

EXAM:
CHEST - 2 VIEW

[chest lat]
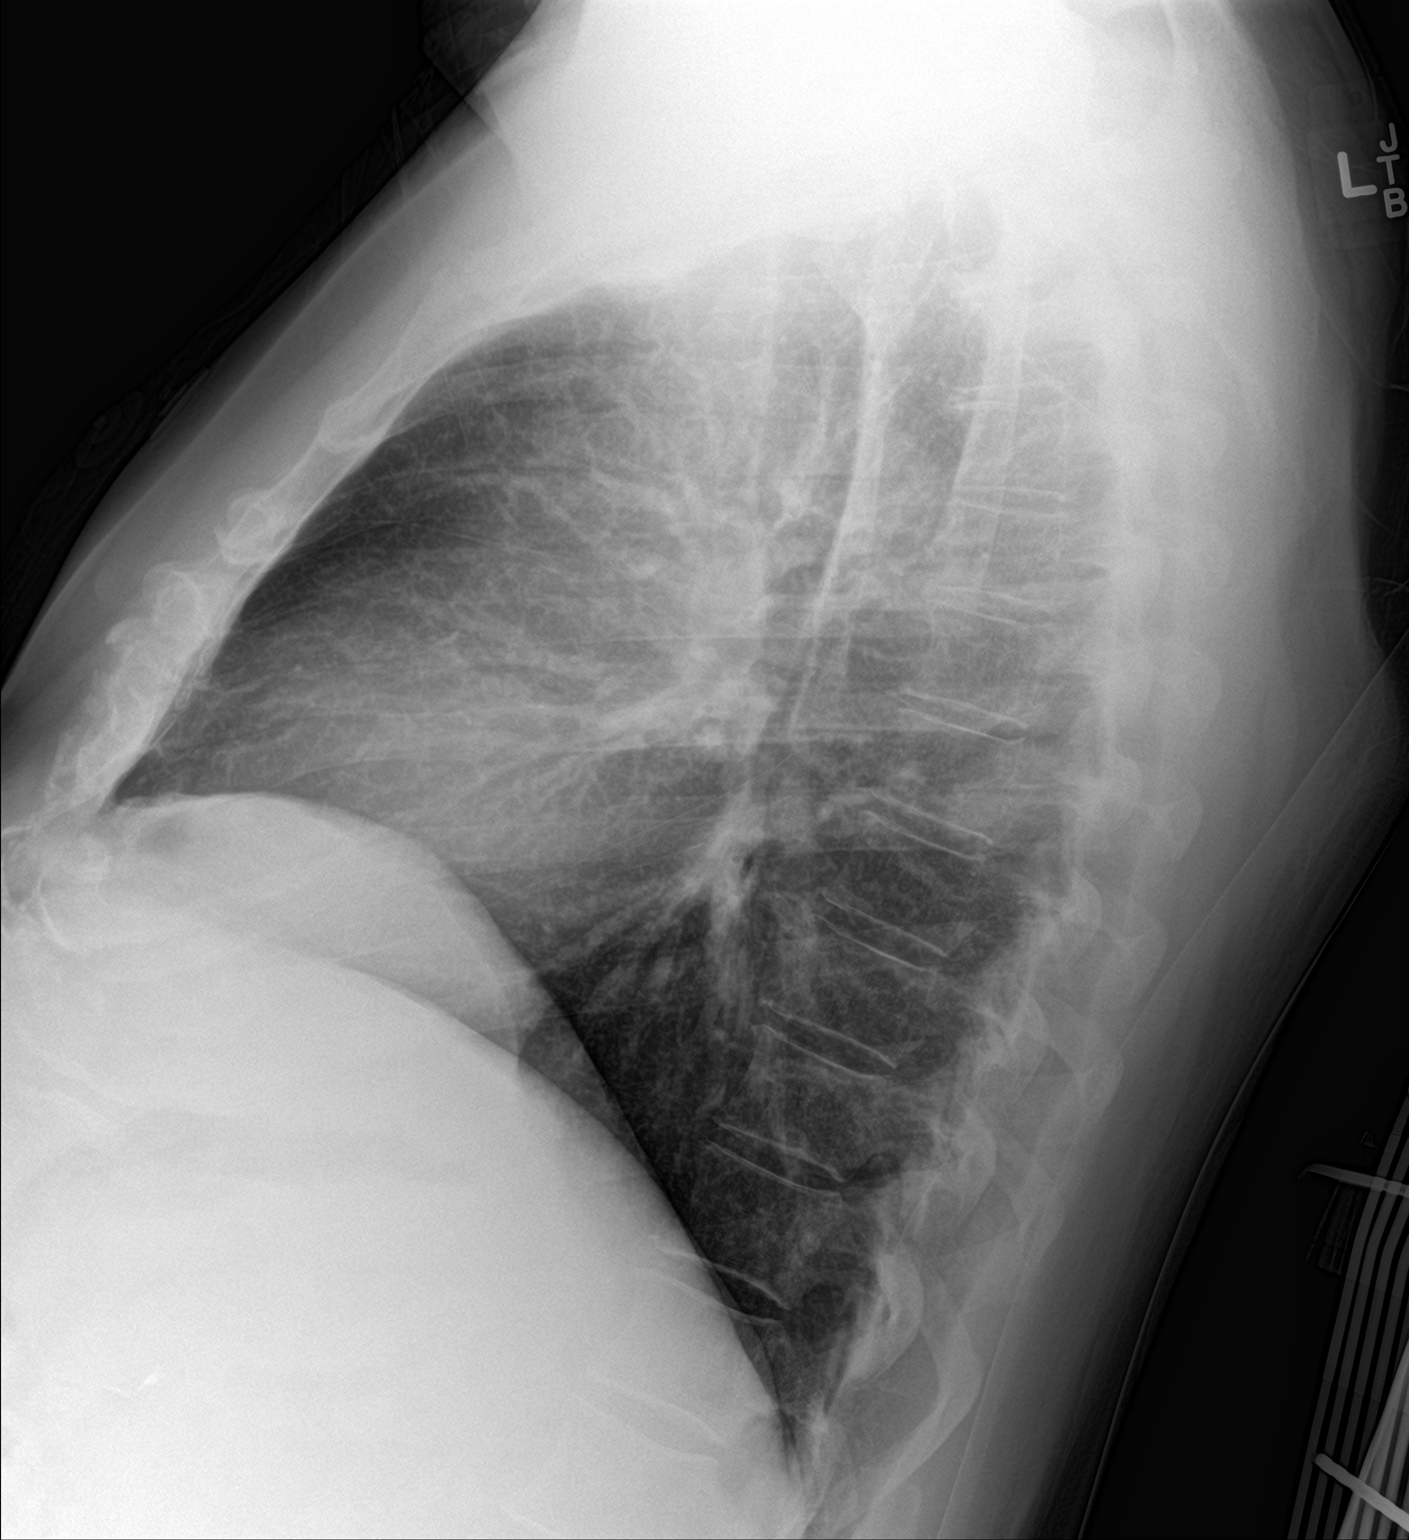

[chest ap]
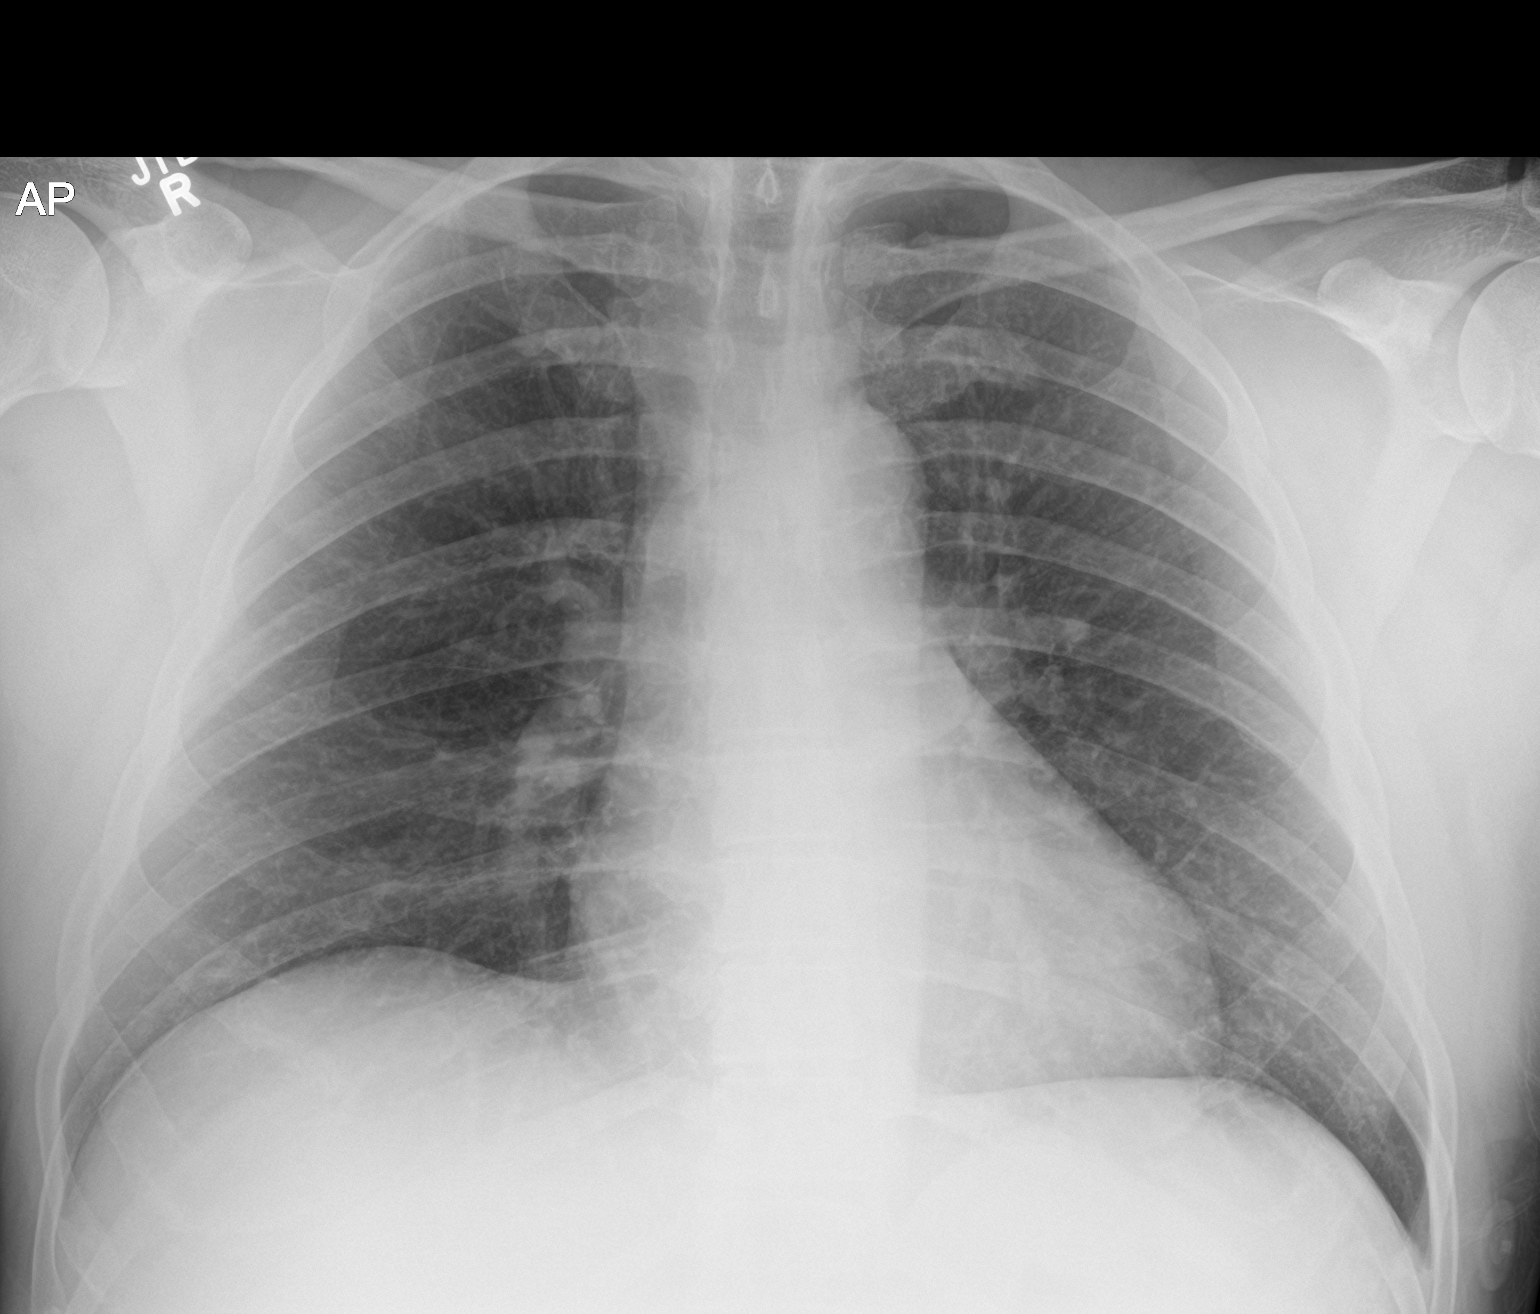

[2 of 2 positions shown; findings below may reference images not displayed]

FINDINGS: The heart size and mediastinal contours are within normal limits.
Both lungs are clear. No pneumothorax or pleural effusion is noted.
The visualized skeletal structures are unremarkable.
IMPRESSION: No active cardiopulmonary disease.

## 2019-10-01 DIAGNOSIS — I8510 Secondary esophageal varices without bleeding: Secondary | ICD-10-CM | POA: Diagnosis not present

## 2019-10-01 DIAGNOSIS — J9859 Other diseases of mediastinum, not elsewhere classified: Secondary | ICD-10-CM | POA: Diagnosis not present

## 2019-10-01 DIAGNOSIS — K766 Portal hypertension: Secondary | ICD-10-CM | POA: Diagnosis not present

## 2019-10-01 DIAGNOSIS — R222 Localized swelling, mass and lump, trunk: Secondary | ICD-10-CM | POA: Diagnosis not present

## 2019-10-01 DIAGNOSIS — I700 Atherosclerosis of aorta: Secondary | ICD-10-CM | POA: Diagnosis not present

## 2019-10-08 DIAGNOSIS — M47814 Spondylosis without myelopathy or radiculopathy, thoracic region: Secondary | ICD-10-CM | POA: Diagnosis not present

## 2019-10-08 DIAGNOSIS — M5124 Other intervertebral disc displacement, thoracic region: Secondary | ICD-10-CM | POA: Diagnosis not present

## 2019-10-08 DIAGNOSIS — G9589 Other specified diseases of spinal cord: Secondary | ICD-10-CM | POA: Diagnosis not present

## 2019-10-08 DIAGNOSIS — J9859 Other diseases of mediastinum, not elsewhere classified: Secondary | ICD-10-CM | POA: Diagnosis not present

## 2019-10-08 DIAGNOSIS — R222 Localized swelling, mass and lump, trunk: Secondary | ICD-10-CM | POA: Diagnosis not present

## 2019-10-24 DIAGNOSIS — R072 Precordial pain: Secondary | ICD-10-CM | POA: Diagnosis not present

## 2019-10-28 DIAGNOSIS — I472 Ventricular tachycardia: Secondary | ICD-10-CM | POA: Diagnosis not present

## 2019-10-28 DIAGNOSIS — I491 Atrial premature depolarization: Secondary | ICD-10-CM | POA: Diagnosis not present

## 2019-10-28 DIAGNOSIS — I471 Supraventricular tachycardia: Secondary | ICD-10-CM | POA: Diagnosis not present

## 2019-11-02 DIAGNOSIS — D492 Neoplasm of unspecified behavior of bone, soft tissue, and skin: Secondary | ICD-10-CM | POA: Insufficient documentation

## 2019-12-14 DIAGNOSIS — I700 Atherosclerosis of aorta: Secondary | ICD-10-CM | POA: Diagnosis not present

## 2019-12-14 DIAGNOSIS — F10239 Alcohol dependence with withdrawal, unspecified: Secondary | ICD-10-CM | POA: Diagnosis not present

## 2019-12-14 DIAGNOSIS — J9811 Atelectasis: Secondary | ICD-10-CM | POA: Diagnosis not present

## 2019-12-14 DIAGNOSIS — R600 Localized edema: Secondary | ICD-10-CM | POA: Diagnosis not present

## 2019-12-14 DIAGNOSIS — E873 Alkalosis: Secondary | ICD-10-CM | POA: Diagnosis not present

## 2019-12-14 DIAGNOSIS — I8501 Esophageal varices with bleeding: Secondary | ICD-10-CM | POA: Diagnosis not present

## 2019-12-14 DIAGNOSIS — Z515 Encounter for palliative care: Secondary | ICD-10-CM | POA: Diagnosis not present

## 2019-12-14 DIAGNOSIS — J3489 Other specified disorders of nose and nasal sinuses: Secondary | ICD-10-CM | POA: Diagnosis not present

## 2019-12-14 DIAGNOSIS — J9859 Other diseases of mediastinum, not elsewhere classified: Secondary | ICD-10-CM | POA: Diagnosis not present

## 2019-12-14 DIAGNOSIS — K767 Hepatorenal syndrome: Secondary | ICD-10-CM | POA: Diagnosis not present

## 2019-12-14 DIAGNOSIS — K7200 Acute and subacute hepatic failure without coma: Secondary | ICD-10-CM | POA: Diagnosis not present

## 2019-12-14 DIAGNOSIS — R4182 Altered mental status, unspecified: Secondary | ICD-10-CM | POA: Diagnosis not present

## 2019-12-14 DIAGNOSIS — E7220 Disorder of urea cycle metabolism, unspecified: Secondary | ICD-10-CM | POA: Diagnosis not present

## 2019-12-14 DIAGNOSIS — K766 Portal hypertension: Secondary | ICD-10-CM | POA: Diagnosis not present

## 2019-12-14 DIAGNOSIS — I672 Cerebral atherosclerosis: Secondary | ICD-10-CM | POA: Diagnosis not present

## 2019-12-14 DIAGNOSIS — R17 Unspecified jaundice: Secondary | ICD-10-CM | POA: Diagnosis not present

## 2019-12-14 DIAGNOSIS — E785 Hyperlipidemia, unspecified: Secondary | ICD-10-CM | POA: Diagnosis not present

## 2019-12-14 DIAGNOSIS — I491 Atrial premature depolarization: Secondary | ICD-10-CM | POA: Diagnosis not present

## 2019-12-14 DIAGNOSIS — K7030 Alcoholic cirrhosis of liver without ascites: Secondary | ICD-10-CM | POA: Diagnosis not present

## 2019-12-14 DIAGNOSIS — E8809 Other disorders of plasma-protein metabolism, not elsewhere classified: Secondary | ICD-10-CM | POA: Diagnosis not present

## 2019-12-14 DIAGNOSIS — K7210 Chronic hepatic failure without coma: Secondary | ICD-10-CM | POA: Diagnosis not present

## 2019-12-14 DIAGNOSIS — N3289 Other specified disorders of bladder: Secondary | ICD-10-CM | POA: Diagnosis not present

## 2019-12-14 DIAGNOSIS — J9600 Acute respiratory failure, unspecified whether with hypoxia or hypercapnia: Secondary | ICD-10-CM | POA: Diagnosis not present

## 2019-12-14 DIAGNOSIS — K920 Hematemesis: Secondary | ICD-10-CM | POA: Diagnosis not present

## 2019-12-14 DIAGNOSIS — G928 Other toxic encephalopathy: Secondary | ICD-10-CM | POA: Diagnosis not present

## 2019-12-14 DIAGNOSIS — I8510 Secondary esophageal varices without bleeding: Secondary | ICD-10-CM | POA: Diagnosis not present

## 2019-12-14 DIAGNOSIS — D62 Acute posthemorrhagic anemia: Secondary | ICD-10-CM | POA: Diagnosis not present

## 2019-12-14 DIAGNOSIS — G9341 Metabolic encephalopathy: Secondary | ICD-10-CM | POA: Diagnosis not present

## 2019-12-14 DIAGNOSIS — D1800 Hemangioma unspecified site: Secondary | ICD-10-CM | POA: Diagnosis not present

## 2019-12-14 DIAGNOSIS — D649 Anemia, unspecified: Secondary | ICD-10-CM | POA: Diagnosis not present

## 2019-12-14 DIAGNOSIS — I6523 Occlusion and stenosis of bilateral carotid arteries: Secondary | ICD-10-CM | POA: Diagnosis not present

## 2019-12-14 DIAGNOSIS — K5730 Diverticulosis of large intestine without perforation or abscess without bleeding: Secondary | ICD-10-CM | POA: Diagnosis not present

## 2019-12-14 DIAGNOSIS — R042 Hemoptysis: Secondary | ICD-10-CM | POA: Diagnosis not present

## 2019-12-14 DIAGNOSIS — K7290 Hepatic failure, unspecified without coma: Secondary | ICD-10-CM | POA: Diagnosis not present

## 2019-12-14 DIAGNOSIS — K922 Gastrointestinal hemorrhage, unspecified: Secondary | ICD-10-CM | POA: Diagnosis not present

## 2019-12-14 DIAGNOSIS — K3189 Other diseases of stomach and duodenum: Secondary | ICD-10-CM | POA: Diagnosis not present

## 2019-12-14 DIAGNOSIS — I998 Other disorder of circulatory system: Secondary | ICD-10-CM | POA: Diagnosis not present

## 2019-12-14 DIAGNOSIS — K921 Melena: Secondary | ICD-10-CM | POA: Diagnosis not present

## 2019-12-14 DIAGNOSIS — I6503 Occlusion and stenosis of bilateral vertebral arteries: Secondary | ICD-10-CM | POA: Diagnosis not present

## 2019-12-14 DIAGNOSIS — I10 Essential (primary) hypertension: Secondary | ICD-10-CM | POA: Diagnosis not present

## 2019-12-14 DIAGNOSIS — K652 Spontaneous bacterial peritonitis: Secondary | ICD-10-CM | POA: Diagnosis not present

## 2019-12-14 DIAGNOSIS — N179 Acute kidney failure, unspecified: Secondary | ICD-10-CM | POA: Diagnosis not present

## 2019-12-14 DIAGNOSIS — D72829 Elevated white blood cell count, unspecified: Secondary | ICD-10-CM | POA: Diagnosis not present

## 2019-12-14 DIAGNOSIS — E870 Hyperosmolality and hypernatremia: Secondary | ICD-10-CM | POA: Diagnosis not present

## 2019-12-14 DIAGNOSIS — R000 Tachycardia, unspecified: Secondary | ICD-10-CM | POA: Diagnosis not present

## 2019-12-14 DIAGNOSIS — F1020 Alcohol dependence, uncomplicated: Secondary | ICD-10-CM | POA: Diagnosis not present

## 2019-12-14 DIAGNOSIS — E7800 Pure hypercholesterolemia, unspecified: Secondary | ICD-10-CM | POA: Diagnosis not present

## 2019-12-14 DIAGNOSIS — D696 Thrombocytopenia, unspecified: Secondary | ICD-10-CM | POA: Diagnosis not present

## 2019-12-14 DIAGNOSIS — K859 Acute pancreatitis without necrosis or infection, unspecified: Secondary | ICD-10-CM | POA: Diagnosis not present

## 2019-12-14 DIAGNOSIS — G9340 Encephalopathy, unspecified: Secondary | ICD-10-CM | POA: Diagnosis not present

## 2019-12-14 DIAGNOSIS — E781 Pure hyperglyceridemia: Secondary | ICD-10-CM | POA: Diagnosis not present

## 2019-12-14 DIAGNOSIS — R531 Weakness: Secondary | ICD-10-CM | POA: Diagnosis not present

## 2019-12-14 DIAGNOSIS — R569 Unspecified convulsions: Secondary | ICD-10-CM | POA: Diagnosis not present

## 2019-12-14 DIAGNOSIS — I6602 Occlusion and stenosis of left middle cerebral artery: Secondary | ICD-10-CM | POA: Diagnosis not present

## 2019-12-14 DIAGNOSIS — I8511 Secondary esophageal varices with bleeding: Secondary | ICD-10-CM | POA: Diagnosis not present

## 2019-12-14 DIAGNOSIS — Z4682 Encounter for fitting and adjustment of non-vascular catheter: Secondary | ICD-10-CM | POA: Diagnosis not present

## 2019-12-21 DIAGNOSIS — F1020 Alcohol dependence, uncomplicated: Secondary | ICD-10-CM | POA: Insufficient documentation

## 2019-12-28 DIAGNOSIS — Z6831 Body mass index (BMI) 31.0-31.9, adult: Secondary | ICD-10-CM | POA: Diagnosis not present

## 2019-12-28 DIAGNOSIS — K118 Other diseases of salivary glands: Secondary | ICD-10-CM | POA: Diagnosis not present

## 2019-12-28 DIAGNOSIS — F1020 Alcohol dependence, uncomplicated: Secondary | ICD-10-CM | POA: Diagnosis not present

## 2019-12-28 DIAGNOSIS — Z23 Encounter for immunization: Secondary | ICD-10-CM | POA: Diagnosis not present

## 2019-12-28 DIAGNOSIS — K7030 Alcoholic cirrhosis of liver without ascites: Secondary | ICD-10-CM | POA: Diagnosis not present

## 2020-01-06 DIAGNOSIS — K7030 Alcoholic cirrhosis of liver without ascites: Secondary | ICD-10-CM | POA: Diagnosis not present

## 2020-01-10 DIAGNOSIS — K118 Other diseases of salivary glands: Secondary | ICD-10-CM | POA: Diagnosis not present

## 2020-01-17 DIAGNOSIS — Z125 Encounter for screening for malignant neoplasm of prostate: Secondary | ICD-10-CM | POA: Diagnosis not present

## 2020-01-17 DIAGNOSIS — Z6829 Body mass index (BMI) 29.0-29.9, adult: Secondary | ICD-10-CM | POA: Diagnosis not present

## 2020-01-17 DIAGNOSIS — Z13220 Encounter for screening for lipoid disorders: Secondary | ICD-10-CM | POA: Diagnosis not present

## 2020-01-17 DIAGNOSIS — Z1331 Encounter for screening for depression: Secondary | ICD-10-CM | POA: Diagnosis not present

## 2020-01-17 DIAGNOSIS — Z0000 Encounter for general adult medical examination without abnormal findings: Secondary | ICD-10-CM | POA: Diagnosis not present

## 2020-02-02 DIAGNOSIS — R569 Unspecified convulsions: Secondary | ICD-10-CM | POA: Diagnosis not present

## 2020-02-03 DIAGNOSIS — D37030 Neoplasm of uncertain behavior of the parotid salivary glands: Secondary | ICD-10-CM | POA: Diagnosis not present

## 2020-02-03 DIAGNOSIS — K118 Other diseases of salivary glands: Secondary | ICD-10-CM | POA: Diagnosis not present

## 2020-02-03 DIAGNOSIS — D110 Benign neoplasm of parotid gland: Secondary | ICD-10-CM | POA: Diagnosis not present

## 2020-02-04 DIAGNOSIS — I6529 Occlusion and stenosis of unspecified carotid artery: Secondary | ICD-10-CM | POA: Diagnosis not present

## 2020-02-04 DIAGNOSIS — I6523 Occlusion and stenosis of bilateral carotid arteries: Secondary | ICD-10-CM | POA: Diagnosis not present

## 2020-03-08 DIAGNOSIS — D492 Neoplasm of unspecified behavior of bone, soft tissue, and skin: Secondary | ICD-10-CM | POA: Diagnosis not present

## 2020-03-08 DIAGNOSIS — R222 Localized swelling, mass and lump, trunk: Secondary | ICD-10-CM | POA: Diagnosis not present

## 2020-03-08 DIAGNOSIS — M47814 Spondylosis without myelopathy or radiculopathy, thoracic region: Secondary | ICD-10-CM | POA: Diagnosis not present

## 2020-03-09 DIAGNOSIS — R509 Fever, unspecified: Secondary | ICD-10-CM | POA: Diagnosis not present

## 2020-03-29 DIAGNOSIS — F17290 Nicotine dependence, other tobacco product, uncomplicated: Secondary | ICD-10-CM | POA: Diagnosis not present

## 2020-03-29 DIAGNOSIS — Z79899 Other long term (current) drug therapy: Secondary | ICD-10-CM | POA: Diagnosis not present

## 2020-03-29 DIAGNOSIS — I8500 Esophageal varices without bleeding: Secondary | ICD-10-CM | POA: Diagnosis not present

## 2020-03-29 DIAGNOSIS — K7030 Alcoholic cirrhosis of liver without ascites: Secondary | ICD-10-CM | POA: Diagnosis not present

## 2020-04-05 ENCOUNTER — Telehealth: Payer: Self-pay

## 2020-04-05 NOTE — Telephone Encounter (Signed)
Mansouraty, Telford Nab., MD  Timothy Lasso, RN; Mansouraty, Telford Nab., MD Chong Sicilian,  I was contacted by a few of my colleagues at [facility] (Dr. Angela Adam and Dr. Willadean Carol of the liver transplant/hepatology group).  They saw a patient by the name of Ray Lewis ([birth year]-lives on [address] in [place], [place]).   Patient was seen by [facility] as a new referral from his PCP for evaluation and follow-up of recent hospitalization in October for alcoholic hepatitis. He was admitted to [facility] and ended up undergoing an upper endoscopy and had esophageal variceal banding. He saw [facility] in follow-up but had not had any other follow-up with GI in the region. He finds it difficult to get to [facility]. He has been sober for 3 months. He has remained on atenolol for hypertension but he did not have a nonselective beta-blocker transition with his prior known esophageal varices. Liver biochemical testing shows transferase elevation of his AST/ALT of 72/24A, INR 1.2, platelet 141. Imaging performed at [facility] regional suggested he had cirrhotic appearing liver but spleen was normal sized. No imaging was pursued at [facility]. The team is wondering if we can move forward with an upper endoscopy for further evaluation of his known esophageal varices (if they are still present now that he is no longer drinking). The patient was interested in a potential transition of care to [facility] since this is closer for him per their discussion.   Ray Lewis, if you can please reach out to the patient (cellular phone [phone]) and let him know that I would be happy to pursue an upper endoscopy for follow-up of his varices and subsequently take over his liver care if he desires. I am okay with the patient coming in for a direct procedure (endoscopy in the hospital-based setting) as long as he has a CBC/CMP/INR performed a week before the procedure. If he wants to be seen in clinic I am happy to see him before the  procedure. But I can also schedule him after the procedure if he likes. If the patient does not want to follow-up with [place] or if his insurance does not covered by [place] then please let me know so I can update the liver team at [facility].   Please make this a telephone note.   At the help as always.

## 2020-04-06 NOTE — Telephone Encounter (Signed)
If his [employer] plan is still the same, then yes we do participate with his insurance plan.

## 2020-04-06 NOTE — Telephone Encounter (Signed)
Patty, thank you for the update. I would like to try to see the patient before that particular scheduled date. He can be scheduled at any blocked slot that I have during in the morning session or afternoon session or can be put at a 3:50 PM slot. If this is the first date that he can actually come then I understand but I would suspect he could probably come earlier. Let me know what he finalizes his decision and I will update the [facility] hepatology team. Thanks. GM

## 2020-04-06 NOTE — Telephone Encounter (Signed)
The pt has returned call- we discussed setting up an appt for endoscopy.  He prefers to come in and see Dr Rush Landmark before he schedules any procedures.  He wants to meet Dr Rush Landmark and discuss his liver issues.  Appt offered for first available in March and he asked for the next April date.  I have him scheduled for 05/30/20 at 60 am. He is going to have all of his records faxed to our office for review. He states he was seen at [place].  Fax number was provided.

## 2020-04-06 NOTE — Telephone Encounter (Signed)
Left message on machine to call back  

## 2020-04-06 NOTE — Telephone Encounter (Signed)
Can one of you check to make sure this pt insurance is in network with our office?  Thank you

## 2020-04-07 NOTE — Telephone Encounter (Signed)
Patty, thanks for the update.  I have updated the [facility] hepatology team. GM

## 2020-04-07 NOTE — Telephone Encounter (Signed)
I spoke with the pt and he prefers to wait until April.  I advised him to call back if he would like to be seen sooner.

## 2020-04-07 NOTE — Telephone Encounter (Signed)
FYI:  Dr Mansouraty  

## 2020-05-10 DIAGNOSIS — Z6832 Body mass index (BMI) 32.0-32.9, adult: Secondary | ICD-10-CM | POA: Diagnosis not present

## 2020-05-10 DIAGNOSIS — K7030 Alcoholic cirrhosis of liver without ascites: Secondary | ICD-10-CM | POA: Diagnosis not present

## 2020-05-10 DIAGNOSIS — Z23 Encounter for immunization: Secondary | ICD-10-CM | POA: Diagnosis not present

## 2020-05-10 DIAGNOSIS — S6992XA Unspecified injury of left wrist, hand and finger(s), initial encounter: Secondary | ICD-10-CM | POA: Diagnosis not present

## 2020-05-16 DIAGNOSIS — K7030 Alcoholic cirrhosis of liver without ascites: Secondary | ICD-10-CM | POA: Diagnosis not present

## 2020-05-30 ENCOUNTER — Other Ambulatory Visit (INDEPENDENT_AMBULATORY_CARE_PROVIDER_SITE_OTHER): Payer: BC Managed Care – PPO

## 2020-05-30 ENCOUNTER — Ambulatory Visit: Payer: BC Managed Care – PPO | Admitting: Gastroenterology

## 2020-05-30 ENCOUNTER — Encounter: Payer: Self-pay | Admitting: Gastroenterology

## 2020-05-30 VITALS — BP 146/78 | HR 76 | Ht 69.0 in | Wt 231.6 lb

## 2020-05-30 DIAGNOSIS — R9389 Abnormal findings on diagnostic imaging of other specified body structures: Secondary | ICD-10-CM

## 2020-05-30 DIAGNOSIS — F1021 Alcohol dependence, in remission: Secondary | ICD-10-CM | POA: Diagnosis not present

## 2020-05-30 DIAGNOSIS — K7030 Alcoholic cirrhosis of liver without ascites: Secondary | ICD-10-CM | POA: Diagnosis not present

## 2020-05-30 DIAGNOSIS — Z1211 Encounter for screening for malignant neoplasm of colon: Secondary | ICD-10-CM

## 2020-05-30 DIAGNOSIS — R945 Abnormal results of liver function studies: Secondary | ICD-10-CM | POA: Diagnosis not present

## 2020-05-30 DIAGNOSIS — K7010 Alcoholic hepatitis without ascites: Secondary | ICD-10-CM

## 2020-05-30 DIAGNOSIS — R932 Abnormal findings on diagnostic imaging of liver and biliary tract: Secondary | ICD-10-CM

## 2020-05-30 DIAGNOSIS — I8511 Secondary esophageal varices with bleeding: Secondary | ICD-10-CM

## 2020-05-30 DIAGNOSIS — K766 Portal hypertension: Secondary | ICD-10-CM | POA: Diagnosis not present

## 2020-05-30 LAB — COMPREHENSIVE METABOLIC PANEL
ALT: 18 U/L (ref 0–53)
AST: 40 U/L — ABNORMAL HIGH (ref 0–37)
Albumin: 3.6 g/dL (ref 3.5–5.2)
Alkaline Phosphatase: 134 U/L — ABNORMAL HIGH (ref 39–117)
BUN: 6 mg/dL (ref 6–23)
CO2: 23 mEq/L (ref 19–32)
Calcium: 9.2 mg/dL (ref 8.4–10.5)
Chloride: 106 mEq/L (ref 96–112)
Creatinine, Ser: 0.75 mg/dL (ref 0.40–1.50)
GFR: 104.21 mL/min (ref >60.00)
Glucose, Bld: 93 mg/dL (ref 70–99)
Potassium: 4.1 mEq/L (ref 3.5–5.1)
Sodium: 137 mEq/L (ref 135–145)
Total Bilirubin: 1.3 mg/dL — ABNORMAL HIGH (ref 0.2–1.2)
Total Protein: 7.5 g/dL (ref 6.0–8.3)

## 2020-05-30 LAB — CBC
HCT: 39.2 % (ref 39.0–52.0)
Hemoglobin: 13.5 g/dL (ref 13.0–17.0)
MCHC: 34.5 g/dL (ref 30.0–36.0)
MCV: 93.9 fl (ref 78.0–100.0)
Platelets: 108 10*3/uL — ABNORMAL LOW (ref 150.0–400.0)
RBC: 4.17 Mil/uL — ABNORMAL LOW (ref 4.22–5.81)
RDW: 16 % — ABNORMAL HIGH (ref 11.5–15.5)
WBC: 6.7 10*3/uL (ref 4.0–10.5)

## 2020-05-30 LAB — TSH: TSH: 2.67 u[IU]/mL (ref 0.35–4.50)

## 2020-05-30 LAB — PROTIME-INR
INR: 1.2 ratio — ABNORMAL HIGH (ref 0.8–1.0)
Prothrombin Time: 13.5 s — ABNORMAL HIGH (ref 9.6–13.1)

## 2020-05-30 MED ORDER — SUPREP BOWEL PREP KIT 17.5-3.13-1.6 GM/177ML PO SOLN: 1.0000 | ORAL | 0 refills | Status: DC

## 2020-05-30 NOTE — Patient Instructions (Signed)
Your provider has requested that you go to the basement level for lab work before leaving today. Press "B" on the elevator. The lab is located at the first door on the left as you exit the elevator.  Due to recent changes in healthcare laws, you may see the results of your imaging and laboratory studies on MyChart before your provider has had a chance to review them.  We understand that in some cases there may be results that are confusing or concerning to you. Not all laboratory results come back in the same time frame and the provider may be waiting for multiple results in order to interpret others.  Please give [place] 48 hours in order for your provider to thoroughly review all the results before contacting the office for clarification of your results.   You have been scheduled for an endoscopy and colonoscopy. Please follow the written instructions given to you at your visit today. Please pick up your prep supplies at the pharmacy within the next 1-3 days. If you use inhalers (even only as needed), please bring them with you on the day of your procedure.   We have sent the following medications to your pharmacy for you to pick up at your convenience: Suprep     If you are age 52 or younger, your body mass index should be between 19-25. Your Body mass index is 34.2 kg/m. If this is out of the aformentioned range listed, please consider follow up with your Primary Care Provider.   Thank you for choosing me and [facility].  Dr. Rush Landmark

## 2020-05-31 LAB — HEPATITIS A ANTIBODY, TOTAL: Hepatitis A AB,Total: REACTIVE — AB

## 2020-05-31 LAB — HEPATITIS B SURFACE ANTIGEN: Hepatitis B Surface Ag: NONREACTIVE

## 2020-05-31 LAB — HEPATITIS B SURFACE ANTIBODY,QUALITATIVE: Hep B S Ab: NONREACTIVE

## 2020-05-31 LAB — HEPATITIS B CORE ANTIBODY, TOTAL: Hep B Core Total Ab: NONREACTIVE

## 2020-05-31 LAB — HEPATITIS C ANTIBODY
Hepatitis C Ab: NONREACTIVE
SIGNAL TO CUT-OFF: 0.02 (ref <1.00)

## 2020-06-01 NOTE — Progress Notes (Signed)
-  Real VISIT   Primary Care Provider Angelina Sheriff, MD [place] [address] [phone]  Referring Provider Angelina Sheriff, MD [place],  [place] [address] [phone]  Patient Profile: Ray Lewis is a 50 y.o. male with a pmh significant for alcoholic hepatitis with concern for chronic liver disease (manifested as esophageal varices), hypertension, hyperlipidemia, OSA status post appendectomy, status post cholecystectomy, Warthin tumor.  The patient presents to the [facility] for an evaluation and management of problem(s) noted below:  Problem List 1. Alcoholic cirrhosis of liver without ascites ([place])   2. Secondary esophageal varices with bleeding (HCC)   3. Portal hypertension ([place])   4. Alcohol use disorder, severe, in early remission ([place])   5. History of alcoholic hepatitis   6. Abnormal liver ultrasound   7. Colon cancer screening     History of Present Illness This is the patient's first visit to the outpatient [facility] clinic.  The patient was admitted to [facility] in October 2021 after presenting with hematemesis and being found to have esophageal varices.  He has a history of previous alcohol use disorder and has had issues of needing to quit alcohol in the past.  During his hospitalization, he required variceal banding.  There was concern of potential stroke which ended up being hepatic encephalopathy but subsequently led to cessation of lactulose prior to his discharge.  Imaging at that time had suggested morphology concerning for cirrhosis.  Since his hospitalization he followed up with his primary care provider and subsequently was seen at [facility] in February 2022 by Dr. Ashok Norris and Dr. Angela Adam.  He has had complete alcohol cessation.  He has not engaged in relapse prevention but understands the need for never drinking alcohol again.  He has had periods of  sobriety over the course of years.  He has had extensive alcohol consumption drinking beer as well as spirits.  He has never had formal inpatient or outpatient rehabilitation.  The patient was previously working full-time at an [employer] but now has gone to a lower amount of work per his own desire to decrease stress and issues.  After his visit in February, the patient was recommended to undergo endoscopic evaluation for follow-up of his previous varices it was at this time that [facility] hepatology reached out to me and for which we set up a clinic visit for this patient.  The patient states he recently underwent a liver ultrasound which suggested a liver with cirrhotic morphology and no liver lesions.  The patient presents today and is not sure if he wants to follow-up in [place] as of yet but wants to meet me so he has a better understanding of who I am and whether he feels comfortable with moving forward with follow-up here or remaining at [facility].  The patient has never had colon cancer screening.  He currently is feeling and doing better and feels better overall since decreasing his work duties.  In the course of the last month he was taking a medication known as liver MD but stopped taking this after about 10 days and feels better not being on it.  This mostly had milk thistle.  The patient denies any issues with jaundice, scleral icterus, generalized pruritus, darkened/amber urine, clay-colored stools, LE edema, hematemesis, coffee-ground emesis, abdominal distention, confusion at this time.  GI Review of Systems Positive as above Negative for pyrosis, dysphagia, odynophagia, nausea, vomiting, pain,  change in bowel habits, melena, hematochezia  Review of Systems General: Denies fevers/chills/weight loss unintentionally HEENT: Denies oral lesions Cardiovascular: Denies chest pain Pulmonary: Denies shortness of breath Gastroenterological: See HPI Genitourinary: Denies darkened urine Hematological:  Denies easy bruising/bleeding Endocrine: Denies temperature intolerance Dermatological: Denies current jaundice Psychological: Mood is stable   Medications Current Outpatient Medications  Medication Sig Dispense Refill  . AMBULATORY NON FORMULARY MEDICATION Medication Name: CBD Gummy-one gummy daily    . atenolol (TENORMIN) 50 MG tablet Take 50 mg by mouth daily.    . Na Sulfate-K Sulfate-Mg Sulf (SUPREP BOWEL PREP KIT) 17.5-3.13-1.6 GM/177ML SOLN Take 1 kit by mouth as directed. For colonoscopy prep 354 mL 0  . phytonadione (VITAMIN K) 5 MG tablet Take 2 tablets (10 mg total) by mouth daily for 7 doses. 14 tablet 0   No current facility-administered medications for this visit.    Allergies No Known Allergies  Histories Past Medical History:  Diagnosis Date  . Arthritis   . Cirrhosis ([place])   . Hyperlipidemia   . Hypertension   . Murmur   . Sleep apnea    Past Surgical History:  Procedure Laterality Date  . ANTERIOR CRUCIATE LIGAMENT REPAIR Right    x2 1998, 2010  . APPENDECTOMY    . CHOLECYSTECTOMY    . UPPER ENDOSCOPY W/ BANDING     Social History   Socioeconomic History  . Marital status: Married    Spouse name: Not on file  . Number of children: Not on file  . Years of education: Not on file  . Highest education level: Not on file  Occupational History  . Not on file  Tobacco Use  . Smoking status: Current Every Day Smoker    Packs/day: 1.00    Types: Cigarettes  . Smokeless tobacco: Never Used  Vaping Use  . Vaping Use: Never used  Substance and Sexual Activity  . Alcohol use: Not Currently  . Drug use: Not Currently  . Sexual activity: Not on file  Other Topics Concern  . Not on file  Social History Narrative  . Not on file   Social Determinants of Health   Financial Resource Strain: Not on file  Food Insecurity: Not on file  Transportation Needs: Not on file  Physical Activity: Not on file  Stress: Not on file  Social Connections: Not on  file  Intimate Partner Violence: Not on file   Family History  Problem Relation Age of Onset  . Breast cancer Mother   . Kidney cancer Maternal Grandfather   . Other Maternal Grandfather        brain tumor  . Colon cancer Maternal Aunt   . Colon cancer Maternal Aunt   . Breast cancer Maternal Aunt   . Breast cancer Maternal Aunt   . Esophageal cancer Neg Hx   . Inflammatory bowel disease Neg Hx   . Liver disease Neg Hx   . Pancreatic cancer Neg Hx   . Stomach cancer Neg Hx    I have reviewed his medical, social, and family history in detail and updated the electronic medical record as necessary.    PHYSICAL EXAMINATION  BP (!) 146/78   Pulse 76   Ht _0  (1.753 m)   Wt 231 lb 9.6 oz (105.1 kg)   BMI 34.20 kg/m  Wt Readings from Last 3 Encounters:  05/30/20 231 lb 9.6 oz (105.1 kg)  03/01/19 253 lb (114.8 kg)  03/10/18 231 lb 1.9 oz (104.8 kg)  GEN:  NAD, appears stated age, doesn't appear chronically ill PSYCH: Cooperative, without pressured speech EYE: Conjunctivae pink, sclerae anicteric ENT: Masked CV: RR without R/Gs  RESP: CTAB posteriorly, without wheezing GI: NABS, soft, NT/ND, without rebound or guarding, mild hepatomegaly appreciated, splenomegaly not appreciated MSK/EXT: No lower extremity edema present SKIN: Spider angiomata noted on upper thorax; no jaundice  NEURO:  Alert & Oriented x 3, no focal deficits, no evidence of asterixis   REVIEW OF DATA  I reviewed the following data at the time of this encounter:  GI Procedures and Studies  Previous EGD performed in October 2021 with report of variceal banding done at outside facility  Laboratory Studies  Reviewed those in epic Unable to access [facility] care everywhere for some reason so I cannot see what laboratories have been performed though the patient states he has known immunity to hepatitis A but is not clear about hepatitis B we will try to see if he is under a different name under care  everywhere  Imaging Studies  October 2021 CT chest abdomen pelvis outside report IMPRESSION:  1. No acute findings within the chest. Bibasilar subsegmental  atelectasis.  2. Findings of cirrhosis with evidence of portal hypertension  including upper abdominal varices and trace perihepatic ascites.  3. Multiple thickened loops of small bowel within the upper abdomen,  which may be related to portal enteropathy versus an infectious or  inflammatory enteritis.  4. There is haziness within the peripancreatic fat, not out of  proportion to the degree of mesenteric infiltration, which may be  related to portal hypertension. Changes related to early acute  pancreatitis would be difficult to exclude. Correlation with serum  lipase is recommended.  5. Mild sigmoid diverticulosis without evidence of acute  diverticulitis.  6. 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 This lesion was more fully  characterized on MRI 10/08/2019.  7. Aortic atherosclerosis. (ICD10-I70.0).   March 2022 right upper quadrant ultrasound outside report Gallbladder surgically absent. Common bile duct 4 mm. Liver with no focal hepatic lesion.  Mild diffuse increased echogenicity.  Subtle nodularity of hepatic contours.  Portal vein is patent on color Doppler imaging with normal directional blood flow towards the liver. Impression suggestive of cirrhotic liver morphology and no hepatic lesion.   ASSESSMENT  Mr. Frasier is a 50 y.o. male with a pmh significant for alcoholic hepatitis with concern for chronic liver disease (manifested as esophageal varices), hypertension, hyperlipidemia, OSA status post appendectomy, status post cholecystectomy, Warthin tumor.  The patient is seen today for evaluation and management of:  1. Alcoholic cirrhosis of  liver without ascites ([place])   2. Secondary esophageal varices with bleeding (HCC)   3. Portal hypertension ([place])   4. Alcohol use disorder, severe, in early remission ([place])   5. History of alcoholic hepatitis   6. Abnormal liver ultrasound   7. Colon cancer screening    The patient is hemodynamically stable.  Clinically he seems to be doing well at this time.  He remains abstinent from alcohol since his recent hospitalization for alcoholic hepatitis.  He has nearly met a 72-monthmark of sobriety.  He has been [employer] account executive  Based on his history, patient seems to be in a compensated state at this time.  Unfortunately, I cannot see any of his laboratories that were [facility](I know they  were done because Dr. Rickey Barbara. Angela Adam had told me about labs being performed) but we will try to access those.  In the interim we will repeat some basic laboratories and follow-up his prior anemia and check to ensure his hepatitis serologies and inquire upon need of vaccination for hepatitis A or hepatitis B.  Based on the patient's recent outside ultrasound, there remains concern for cirrhotic morphology.  If patient has evidence of thrombocytopenia that I suspect he likely does have underlying chronic liver disease and cirrhosis as a result of his alcohol use disorder.  Upper endoscopy is recommended for follow-up of his previously bleeding esophageal varices.  Colonoscopy for colon cancer screening is recommended as well.  The patient will decide if he wants to have these procedures performed here in [place] after our discussion, he is leaning towards that at this time.  [facility] is also [facility] and will see about his insurance from the [place] also being used to potentially cover his procedures.  Due to the potential need of variceal banding if there remains high risk stigmata, his procedures will be scheduled in the hospital-based outpatient setting.  He has an upcoming visit with [facility] hepatology  in the course the next week and 1/2 to 2 weeks.  He will follow-up with them.  I will be happy to co-manage the patient with [facility] hepatology.  We will get his meld sodium labs and see where he is in regards to risk and potential need for liver transplantation though I suspect his risk and current meld sodium levels will be low.  The risks and benefits of endoscopic evaluation were discussed with the patient; these include but are not limited to the risk of perforation, infection, bleeding, missed lesions, lack of diagnosis, severe illness requiring hospitalization, as well as anesthesia and sedation related illnesses.  The patient is agreeable to proceed.  All patient questions were answered to the best of my ability, and the patient agrees to the aforementioned plan of action with follow-up as indicated.   PLAN  Laboratories as outlined below EGD for variceal surveillance to be scheduled Colonoscopy for colon cancer screening to be scheduled Follow-up with [facility] hepatology as is already scheduled for the next couple of weeks  #ESLD Management Volume -Does not seem to be an issue currently so no need for Lasix or spironolactone -Monitor weight weekly -1500-2000 mg Na diet Infection -No evidence of significant ascites so concern for SBP very low Bleeding -Needs updated EGD Encephalopathy -Not an issue currently Screening -Up-to-date based on 3/22 liver ultrasound 9/22 Transplant -We will follow up with [facility] hepatology though suspect mild sodium will be low enough where he will not be a candidate for needed Vaccination -We will assess for HAV/HBV immunity  -PCP to consider influenza pneumococcal vaccines (Be sure to give Prevnar-13 and in 8-weeks Pneumovax-23)   Orders Placed This Encounter  Procedures  . Procedural/ Surgical Case Request: ESOPHAGOGASTRODUODENOSCOPY (EGD) WITH PROPOFOL, COLONOSCOPY WITH PROPOFOL  . CBC  . Comp Met (CMET)  . TSH  . INR/PT  . Hepatitis A antibody,  total  . Hepatitis B Surface AntiGEN  . Hepatitis B Surface AntiBODY  . Hepatitis B Core Antibody, total  . Hepatitis C Antibody  . Ambulatory referral to Gastroenterology    New Prescriptions   NA SULFATE-K SULFATE-MG SULF (SUPREP BOWEL PREP KIT) 17.5-3.13-1.6 GM/177ML SOLN    Take 1 kit by mouth as directed. For colonoscopy prep   PHYTONADIONE (VITAMIN K) 5 MG TABLET  Take 2 tablets (10 mg total) by mouth daily for 7 doses.   Modified Medications   No medications on file    Planned Follow Up No follow-ups on file.   Total Time in Face-to-Face and in Coordination of Care for patient including independent/personal interpretation/review of prior testing, medical history, examination, medication adjustment, communicating results with the patient directly, and documentation with the EHR is 50 minutes.   Justice Britain, MD [facility] Office # [phone]

## 2020-06-02 ENCOUNTER — Encounter: Payer: Self-pay | Admitting: Gastroenterology

## 2020-06-02 ENCOUNTER — Other Ambulatory Visit: Payer: Self-pay

## 2020-06-02 DIAGNOSIS — I8511 Secondary esophageal varices with bleeding: Secondary | ICD-10-CM | POA: Insufficient documentation

## 2020-06-02 DIAGNOSIS — F1021 Alcohol dependence, in remission: Secondary | ICD-10-CM | POA: Insufficient documentation

## 2020-06-02 DIAGNOSIS — K766 Portal hypertension: Secondary | ICD-10-CM | POA: Insufficient documentation

## 2020-06-02 DIAGNOSIS — K7030 Alcoholic cirrhosis of liver without ascites: Secondary | ICD-10-CM | POA: Insufficient documentation

## 2020-06-02 DIAGNOSIS — K7010 Alcoholic hepatitis without ascites: Secondary | ICD-10-CM

## 2020-06-02 DIAGNOSIS — Z1211 Encounter for screening for malignant neoplasm of colon: Secondary | ICD-10-CM | POA: Insufficient documentation

## 2020-06-02 DIAGNOSIS — R932 Abnormal findings on diagnostic imaging of liver and biliary tract: Secondary | ICD-10-CM | POA: Insufficient documentation

## 2020-06-02 MED ORDER — PHYTONADIONE 5 MG PO TABS: 10.0000 mg | ORAL_TABLET | Freq: Every day | ORAL | 0 refills | Status: AC

## 2020-06-05 DIAGNOSIS — K7030 Alcoholic cirrhosis of liver without ascites: Secondary | ICD-10-CM | POA: Diagnosis not present

## 2020-08-02 DIAGNOSIS — R569 Unspecified convulsions: Secondary | ICD-10-CM | POA: Diagnosis not present

## 2020-08-10 NOTE — Progress Notes (Signed)
Attempted to obtain medical history via telephone, unable to reach at this time. I left a voicemail to return pre surgical testing department's phone call.  

## 2020-08-11 ENCOUNTER — Telehealth: Payer: Self-pay | Admitting: Gastroenterology

## 2020-08-11 ENCOUNTER — Other Ambulatory Visit (HOSPITAL_COMMUNITY)
Admission: RE | Admit: 2020-08-11 | Discharge: 2020-08-11 | Disposition: A | Discharge status: Home / Self Care | Payer: BC Managed Care – PPO | Source: Ambulatory Visit | Attending: Gastroenterology | Admitting: Gastroenterology

## 2020-08-11 DIAGNOSIS — Z01812 Encounter for preprocedural laboratory examination: Secondary | ICD-10-CM | POA: Diagnosis not present

## 2020-08-11 DIAGNOSIS — Z20822 Contact with and (suspected) exposure to covid-19: Secondary | ICD-10-CM | POA: Diagnosis not present

## 2020-08-11 LAB — SARS CORONAVIRUS 2 (TAT 6-24 HRS): SARS Coronavirus 2: NEGATIVE

## 2020-08-11 NOTE — Telephone Encounter (Signed)
Inbound call from patient. He have procedure at [facility] 6/22. States the [place] need paperwork of where the procedure is being done so it can generate a claim.

## 2020-08-14 NOTE — Telephone Encounter (Signed)
Ray Lewis, I have been on vacation. Can you help me with this. Is there a letter and some type of form that I should be faxing over to the [place] ?

## 2020-08-15 NOTE — Telephone Encounter (Signed)
Es, this is the case I sent you an email on Friday from the preservice center.  Can you help Ro on what's going on with it?

## 2020-08-15 NOTE — Anesthesia Preprocedure Evaluation (Addendum)
Anesthesia Evaluation  Patient identified by MRN, date of birth, ID band Patient awake    Reviewed: Allergy & Precautions, NPO status , Patient's Chart, lab work & pertinent test results, reviewed documented beta blocker date and time   History of Anesthesia Complications Negative for: history of anesthetic complications  Airway Mallampati: II  TM Distance: >3 FB Neck ROM: Full    Dental  (+) Dental Advisory Given   Pulmonary sleep apnea , Current SmokerPatient did not abstain from smoking.,    Pulmonary exam normal        Cardiovascular hypertension, Pt. on medications and Pt. on home beta blockers Normal cardiovascular exam     Neuro/Psych negative neurological ROS  negative psych ROS   GI/Hepatic negative GI ROS, (+) Cirrhosis     substance abuse  alcohol use,   Endo/Other   Obesity   Renal/GU negative Renal ROS     Musculoskeletal  (+) Arthritis ,   Abdominal   Peds  Hematology negative hematology ROS (+)   Anesthesia Other Findings Covid test negative   Reproductive/Obstetrics                            Anesthesia Physical Anesthesia Plan  ASA: 3  Anesthesia Plan: MAC   Post-op Pain Management:    Induction: Intravenous  PONV Risk Score and Plan: 1 and Propofol infusion and Treatment may vary due to age or medical condition  Airway Management Planned: Nasal Cannula and Natural Airway  Additional Equipment: None  Intra-op Plan:   Post-operative Plan:   Informed Consent: I have reviewed the patients History and Physical, chart, labs and discussed the procedure including the risks, benefits and alternatives for the proposed anesthesia with the patient or authorized representative who has indicated his/her understanding and acceptance.       Plan Discussed with: CRNA and Anesthesiologist  Anesthesia Plan Comments:        Anesthesia Quick Evaluation

## 2020-08-16 ENCOUNTER — Ambulatory Visit (HOSPITAL_COMMUNITY)
Admission: RE | Admit: 2020-08-16 | Discharge: 2020-08-16 | Disposition: A | Discharge status: Home / Self Care | Payer: BC Managed Care – PPO | Attending: Gastroenterology | Admitting: Gastroenterology

## 2020-08-16 ENCOUNTER — Ambulatory Visit (HOSPITAL_COMMUNITY): Payer: BC Managed Care – PPO | Admitting: Anesthesiology

## 2020-08-16 ENCOUNTER — Other Ambulatory Visit: Payer: Self-pay

## 2020-08-16 ENCOUNTER — Encounter (HOSPITAL_COMMUNITY): Payer: Self-pay | Admitting: Gastroenterology

## 2020-08-16 ENCOUNTER — Encounter (HOSPITAL_COMMUNITY)
Admission: RE | Disposition: A | Discharge status: Home / Self Care | Payer: Self-pay | Source: Home / Self Care | Attending: Gastroenterology

## 2020-08-16 DIAGNOSIS — K635 Polyp of colon: Secondary | ICD-10-CM | POA: Insufficient documentation

## 2020-08-16 DIAGNOSIS — Z800 Family history of malignant neoplasm of digestive organs: Secondary | ICD-10-CM | POA: Insufficient documentation

## 2020-08-16 DIAGNOSIS — K641 Second degree hemorrhoids: Secondary | ICD-10-CM | POA: Insufficient documentation

## 2020-08-16 DIAGNOSIS — F17210 Nicotine dependence, cigarettes, uncomplicated: Secondary | ICD-10-CM | POA: Insufficient documentation

## 2020-08-16 DIAGNOSIS — I8511 Secondary esophageal varices with bleeding: Secondary | ICD-10-CM | POA: Diagnosis not present

## 2020-08-16 DIAGNOSIS — D126 Benign neoplasm of colon, unspecified: Secondary | ICD-10-CM | POA: Diagnosis not present

## 2020-08-16 DIAGNOSIS — R932 Abnormal findings on diagnostic imaging of liver and biliary tract: Secondary | ICD-10-CM

## 2020-08-16 DIAGNOSIS — Z79899 Other long term (current) drug therapy: Secondary | ICD-10-CM | POA: Diagnosis not present

## 2020-08-16 DIAGNOSIS — K3189 Other diseases of stomach and duodenum: Secondary | ICD-10-CM | POA: Diagnosis not present

## 2020-08-16 DIAGNOSIS — K7010 Alcoholic hepatitis without ascites: Secondary | ICD-10-CM

## 2020-08-16 DIAGNOSIS — I8510 Secondary esophageal varices without bleeding: Secondary | ICD-10-CM | POA: Insufficient documentation

## 2020-08-16 DIAGNOSIS — K5730 Diverticulosis of large intestine without perforation or abscess without bleeding: Secondary | ICD-10-CM | POA: Diagnosis not present

## 2020-08-16 DIAGNOSIS — K922 Gastrointestinal hemorrhage, unspecified: Secondary | ICD-10-CM | POA: Diagnosis not present

## 2020-08-16 DIAGNOSIS — K644 Residual hemorrhoidal skin tags: Secondary | ICD-10-CM | POA: Insufficient documentation

## 2020-08-16 DIAGNOSIS — K621 Rectal polyp: Secondary | ICD-10-CM | POA: Diagnosis not present

## 2020-08-16 DIAGNOSIS — Z1211 Encounter for screening for malignant neoplasm of colon: Secondary | ICD-10-CM | POA: Diagnosis not present

## 2020-08-16 DIAGNOSIS — K7030 Alcoholic cirrhosis of liver without ascites: Secondary | ICD-10-CM

## 2020-08-16 DIAGNOSIS — K766 Portal hypertension: Secondary | ICD-10-CM | POA: Diagnosis not present

## 2020-08-16 HISTORY — PX: POLYPECTOMY: SHX5525

## 2020-08-16 HISTORY — PX: SUBMUCOSAL LIFTING INJECTION: SHX6855

## 2020-08-16 HISTORY — PX: ENDOSCOPIC MUCOSAL RESECTION: SHX6839

## 2020-08-16 HISTORY — PX: HEMOSTASIS CLIP PLACEMENT: SHX6857

## 2020-08-16 HISTORY — PX: COLONOSCOPY WITH PROPOFOL: SHX5780

## 2020-08-16 HISTORY — PX: BIOPSY: SHX5522

## 2020-08-16 HISTORY — PX: ESOPHAGOGASTRODUODENOSCOPY (EGD) WITH PROPOFOL: SHX5813

## 2020-08-16 SURGERY — ESOPHAGOGASTRODUODENOSCOPY (EGD) WITH PROPOFOL
Anesthesia: Monitor Anesthesia Care

## 2020-08-16 MED ORDER — PROPOFOL 10 MG/ML IV BOLUS
INTRAVENOUS | Status: DC | PRN
  Administered 2020-08-16 (×2): 30 mg via INTRAVENOUS
  Administered 2020-08-16: 20 mg via INTRAVENOUS
  Administered 2020-08-16: 30 mg via INTRAVENOUS
  Administered 2020-08-16 (×2): 20 mg via INTRAVENOUS

## 2020-08-16 MED ORDER — PROPOFOL 1000 MG/100ML IV EMUL
INTRAVENOUS | Status: AC
  Filled 2020-08-16: qty 200

## 2020-08-16 MED ORDER — SODIUM CHLORIDE 0.9 % IV SOLN: INTRAVENOUS | Status: DC

## 2020-08-16 MED ORDER — LIDOCAINE 2% (20 MG/ML) 5 ML SYRINGE
INTRAMUSCULAR | Status: DC | PRN
  Administered 2020-08-16: 100 mg via INTRAVENOUS

## 2020-08-16 MED ORDER — LACTATED RINGERS IV SOLN
INTRAVENOUS | Status: DC
  Administered 2020-08-16: 1000 mL via INTRAVENOUS

## 2020-08-16 MED ORDER — PROPOFOL 500 MG/50ML IV EMUL
INTRAVENOUS | Status: DC | PRN
  Administered 2020-08-16: 160 ug/kg/min via INTRAVENOUS

## 2020-08-16 MED ORDER — EPHEDRINE SULFATE-NACL 50-0.9 MG/10ML-% IV SOSY
PREFILLED_SYRINGE | INTRAVENOUS | Status: DC | PRN
  Administered 2020-08-16 (×3): 10 mg via INTRAVENOUS

## 2020-08-16 MED ORDER — PROPOFOL 1000 MG/100ML IV EMUL
INTRAVENOUS | Status: AC
  Filled 2020-08-16: qty 100

## 2020-08-16 MED ORDER — PHENYLEPHRINE 40 MCG/ML (10ML) SYRINGE FOR IV PUSH (FOR BLOOD PRESSURE SUPPORT)
PREFILLED_SYRINGE | INTRAVENOUS | Status: DC | PRN
  Administered 2020-08-16: 80 ug via INTRAVENOUS
  Administered 2020-08-16 (×4): 40 ug via INTRAVENOUS

## 2020-08-16 MED ORDER — OMEPRAZOLE 40 MG PO CPDR: 40.0000 mg | DELAYED_RELEASE_CAPSULE | Freq: Every day | ORAL | 6 refills | Status: DC

## 2020-08-16 SURGICAL SUPPLY — 24 items

## 2020-08-16 NOTE — Op Note (Signed)
[facility] Patient Name: Ray Lewis Procedure Date: 08/16/2020 MRN: [id number] Attending MD: Justice Britain , MD Date of Birth: 1970-09-12 CSN: [id number] Age: 50 Admit Type: Outpatient Procedure:                Colonoscopy Indications:              Screening for colorectal malignant neoplasm, This                            is the patient's first colonoscopy Providers:                Justice Britain, MD, Josie Dixon, RN, Ladona Ridgel, Technician, Caryl Pina CRNA Referring MD:             [place] Medicines:                Monitored Anesthesia Care Complications:            No immediate complications. Estimated Blood Loss:     Estimated blood loss was minimal. Procedure:                Pre-Anesthesia Assessment:                           - Prior to the procedure, a History and Physical                            was performed, and patient medications and                            allergies were reviewed. The patient's tolerance of                            previous anesthesia was also reviewed. The risks                            and benefits of the procedure and the sedation                            options and risks were discussed with the patient.                            All questions were answered, and informed consent                            was obtained. Prior Anticoagulants: The patient has                            taken no previous anticoagulant or antiplatelet                            agents. ASA Grade Assessment: III - A patient with  severe systemic disease. After reviewing the risks                            and benefits, the patient was deemed in                            satisfactory condition to undergo the procedure.                           After obtaining informed consent, the colonoscope                            was passed under  direct vision. Throughout the                            procedure, the patient's blood pressure, pulse, and                            oxygen saturations were monitored continuously. The                            CF-HQ190L ([id number] Olympus colonoscope was                            introduced through the anus and advanced to the 5                            cm into the ileum. The colonoscopy was performed                            without difficulty. The patient tolerated the                            procedure. The quality of the bowel preparation was                            adequate. The terminal ileum, ileocecal valve,                            appendiceal orifice, and rectum were photographed. Scope In: 8:07:45 AM Scope Out: 8:46:31 AM Scope Withdrawal Time: 0 hours 32 minutes 43 seconds  Total Procedure Duration: 0 hours 38 minutes 46 seconds  Findings:      The digital rectal exam findings include hemorrhoids. Pertinent       negatives include no palpable rectal lesions.      A large amount of liquid semi-liquid stool was found in the entire       colon, making visualization difficult. Lavage of the area was performed       using copious amounts, resulting in clearance with adequate       visualization.      The terminal ileum and ileocecal valve appeared normal.      13 sessile polyps were found in the rectum (3), recto-sigmoid colon (4),       descending colon (4), transverse colon (1) and ascending colon (1). The  polyps were 2 to 8 mm in size. These polyps were removed with a cold       snare. Resection and retrieval were complete.      A 22 mm polyp was found in the proximal rectum. The polyp was       semi-sessile. Preparations were made for mucosal resection. [facility]       and White-light Endoscopy was done to demarcate the borders of the       lesion. Orise gel was injected to raise the lesion. Snare mucosal       resection was performed. Resection and  retrieval were complete. Mild       oozing noted from a small vessel which was coagulated safely. To prevent       further bleeding after mucosal resection, five hemostatic clips were       successfully placed (MR conditional). There was no bleeding at the end       of the procedure.      A few small-mouthed diverticula were found in the recto-sigmoid colon.      Normal mucosa was found in the entire colon otherwise.      Non-bleeding non-thrombosed external and internal hemorrhoids were found       during retroflexion, during perianal exam and during digital exam. The       hemorrhoids were Grade II (internal hemorrhoids that prolapse but reduce       spontaneously). Impression:               - Hemorrhoids found on digital rectal exam.                           - Stool in the entire examined colon - lavaged with                            adequate visualization.                           - The examined portion of the ileum was normal.                           - 13, 2 to 8 mm polyps in the rectum, at the                            recto-sigmoid colon, in the descending colon, in                            the transverse colon and in the ascending colon,                            removed with a cold snare. Resected and retrieved.                           - One 22 mm polyp in the proximal rectum, removed                            with mucosal resection. Resected and retrieved.  Clips (MR conditional) were placed.                           - Diverticulosis in the recto-sigmoid colon.                           - Normal mucosa in the entire examined colon                            otherwise.                           - Non-bleeding non-thrombosed external and internal                            hemorrhoids. Moderate Sedation:      Not Applicable - Patient had care per Anesthesia. Recommendation:           - The patient will be observed post-procedure,                             until all discharge criteria are met.                           - Discharge patient to home.                           - Patient has a contact number available for                            emergencies. The signs and symptoms of potential                            delayed complications were discussed with the                            patient. Return to normal activities tomorrow.                            Written discharge instructions were provided to the                            patient.                           - High fiber diet.                           - Use FiberCon 1-2 tablets PO daily.                           - Continue present medications.                           - No aspirin, ibuprofen, naproxen, or other  non-steroidal anti-inflammatory drugs after polyp                            removal.                           - Await pathology results.                           - Repeat colonoscopy in 1 year for surveillance due                            to number of polyps found on this index colonoscopy.                           - The findings and recommendations were discussed                            with the patient.                           - The findings and recommendations were discussed                            with the patient's family. Procedure Code(s):        --- Professional ---                           [phone], Colonoscopy, flexible; with endoscopic                            mucosal resection                           45385, 90, Colonoscopy, flexible; with removal of                            tumor(s), polyp(s), or other lesion(s) by snare                            technique Diagnosis Code(s):        --- Professional ---                           Z12.11, Encounter for screening for malignant                            neoplasm of colon                           K64.1, Second degree hemorrhoids                            K62.1, Rectal polyp                           K63.5, Polyp of colon CPT copyright 2019 [employer] Association. All rights  reserved. The codes documented in this report are preliminary and upon coder review may  be revised to meet current compliance requirements. Justice Britain, MD 08/16/2020 9:03:46 AM Number of Addenda: 0

## 2020-08-16 NOTE — Discharge Instructions (Signed)
YOU HAD AN ENDOSCOPIC PROCEDURE TODAY: Refer to the procedure report and other information in the discharge instructions given to you for any specific questions about what was found during the examination. If this information does not answer your questions, please call [place] office at [phone] to clarify.  ° °YOU SHOULD EXPECT: Some feelings of bloating in the abdomen. Passage of more gas than usual. Walking can help get rid of the air that was put into your GI tract during the procedure and reduce the bloating. If you had a lower endoscopy (such as a colonoscopy or flexible sigmoidoscopy) you may notice spotting of blood in your stool or on the toilet paper. Some abdominal soreness may be present for a day or two, also. ° °DIET: Your first meal following the procedure should be a light meal and then it is ok to progress to your normal diet. A half-sandwich or bowl of soup is an example of a good first meal. Heavy or fried foods are harder to digest and may make you feel nauseous or bloated. Drink plenty of fluids but you should avoid alcoholic beverages for 24 hours. If you had a esophageal dilation, please see attached instructions for diet.   ° °ACTIVITY: Your care partner should take you home directly after the procedure. You should plan to take it easy, moving slowly for the rest of the day. You can resume normal activity the day after the procedure however YOU SHOULD NOT DRIVE, use power tools, machinery or perform tasks that involve climbing or major physical exertion for 24 hours (because of the sedation medicines used during the test).  ° °SYMPTOMS TO REPORT IMMEDIATELY: °A gastroenterologist can be reached at any hour. Please call [phone]  for any of the following symptoms:  °Following lower endoscopy (colonoscopy, flexible sigmoidoscopy) °Excessive amounts of blood in the stool  °Significant tenderness, worsening of abdominal pains  °Swelling of the abdomen that is new, acute  °Fever of 100° or  higher  °Following upper endoscopy (EGD, EUS, ERCP, esophageal dilation) °Vomiting of blood or coffee ground material  °New, significant abdominal pain  °New, significant chest pain or pain under the shoulder blades  °Painful or persistently difficult swallowing  °New shortness of breath  °Black, tarry-looking or red, bloody stools ° °FOLLOW UP:  °If any biopsies were taken you will be contacted by phone or by letter within the next 1-3 weeks. Call [phone]  if you have not heard about the biopsies in 3 weeks.  °Please also call with any specific questions about appointments or follow up tests. ° °

## 2020-08-16 NOTE — H&P (Signed)
GASTROENTEROLOGY PROCEDURE H&P NOTE   Primary Care Physician: Angelina Sheriff, MD  HPI: Ray Lewis is a 50 y.o. male who presents for EGD/Colonoscopy for variceal surveillance and colon cancer screening.  Past Medical History:  Diagnosis Date   Arthritis    Cirrhosis ([place])    Hyperlipidemia    Hypertension    Murmur    Sleep apnea    Past Surgical History:  Procedure Laterality Date   ANTERIOR CRUCIATE LIGAMENT REPAIR Right    x2 1998, 2010   APPENDECTOMY     CHOLECYSTECTOMY     UPPER ENDOSCOPY W/ BANDING     Current Facility-Administered Medications  Medication Dose Route Frequency Provider Last Rate Last Admin   0.9 %  sodium chloride infusion   Intravenous Continuous Mansouraty, Telford Nab., MD       lactated ringers infusion   Intravenous Continuous Mansouraty, Telford Nab., MD 10 mL/hr at 08/16/20 0746 Continued from Pre-op at 08/16/20 0746   No Known Allergies Family History  Problem Relation Age of Onset   Breast cancer Mother    Kidney cancer Maternal Grandfather    Other Maternal Grandfather        brain tumor   Colon cancer Maternal Aunt    Colon cancer Maternal Aunt    Breast cancer Maternal Aunt    Breast cancer Maternal Aunt    Esophageal cancer Neg Hx    Inflammatory bowel disease Neg Hx    Liver disease Neg Hx    Pancreatic cancer Neg Hx    Stomach cancer Neg Hx    Social History   Socioeconomic History   Marital status: Married    Spouse name: Not on file   Number of children: Not on file   Years of education: Not on file   Highest education level: Not on file  Occupational History   Not on file  Tobacco Use   Smoking status: Every Day    Packs/day: 1.00    Pack years: 0.00    Types: Cigarettes   Smokeless tobacco: Never  Vaping Use   Vaping Use: Never used  Substance and Sexual Activity   Alcohol use: Not Currently   Drug use: Not Currently   Sexual activity: Not on file  Other Topics Concern   Not on file  Social  History Narrative   Not on file   Social Determinants of Health   Financial Resource Strain: Not on file  Food Insecurity: Not on file  Transportation Needs: Not on file  Physical Activity: Not on file  Stress: Not on file  Social Connections: Not on file  Intimate Partner Violence: Not on file    Physical Exam: Vital signs in last 24 hours: Temp:  [98.1 F (36.7 C)] 98.1 F (36.7 C) (06/22 0649) Pulse Rate:  [60] 60 (06/22 0649) Resp:  [17] 17 (06/22 0649) BP: (124)/(46) 124/46 (06/22 0649) SpO2:  [100 %] 100 % (06/22 0649) Weight:  [95.7 kg] 95.7 kg (06/22 0649)   GEN: NAD EYE: Sclerae anicteric ENT: MMM CV: Non-tachycardic GI: Soft, NT/ND NEURO:  Alert & Oriented x 3  Lab Results: No results for input(s): WBC, HGB, HCT, PLT in the last 72 hours. BMET No results for input(s): NA, K, CL, CO2, GLUCOSE, BUN, CREATININE, CALCIUM in the last 72 hours. LFT No results for input(s): PROT, ALBUMIN, AST, ALT, ALKPHOS, BILITOT, BILIDIR, IBILI in the last 72 hours. PT/INR No results for input(s): LABPROT, INR in the last 72 hours.  Impression / Plan: This is a 50 y.o.male who presents for EGD/Colonoscopy for variceal surveillance and colon cancer screening.  The risks and benefits of endoscopic evaluation were discussed with the patient; these include but are not limited to the risk of perforation, infection, bleeding, missed lesions, lack of diagnosis, severe illness requiring hospitalization, as well as anesthesia and sedation related illnesses.  The patient is agreeable to proceed.    Justice Britain, MD [facility] Office # [phone]

## 2020-08-16 NOTE — Anesthesia Postprocedure Evaluation (Signed)
Anesthesia Post Note  Patient: Ray Lewis  Procedure(s) Performed: ESOPHAGOGASTRODUODENOSCOPY (EGD) WITH PROPOFOL COLONOSCOPY WITH PROPOFOL BIOPSY POLYPECTOMY ENDOSCOPIC MUCOSAL RESECTION HEMOSTASIS CLIP PLACEMENT     Patient location during evaluation: PACU Anesthesia Type: MAC Level of consciousness: awake and alert Pain management: pain level controlled Vital Signs Assessment: post-procedure vital signs reviewed and stable Respiratory status: spontaneous breathing, nonlabored ventilation and respiratory function stable Cardiovascular status: stable and blood pressure returned to baseline Anesthetic complications: no   No notable events documented.  Last Vitals:  Vitals:   08/16/20 0910 08/16/20 0920  BP: (!) 113/45 123/78  Pulse: 71 65  Resp: (!) 22 15  Temp:    SpO2: 98% 98%    Last Pain:  Vitals:   08/16/20 0920  TempSrc:   PainSc: 0-No pain                 Audry Pili

## 2020-08-16 NOTE — Transfer of Care (Signed)
Immediate Anesthesia Transfer of Care Note  Patient: Ray Lewis  Procedure(s) Performed: ESOPHAGOGASTRODUODENOSCOPY (EGD) WITH PROPOFOL COLONOSCOPY WITH PROPOFOL BIOPSY POLYPECTOMY ENDOSCOPIC MUCOSAL RESECTION HEMOSTASIS CLIP PLACEMENT  Patient Location: Endoscopy Unit  Anesthesia Type:MAC  Level of Consciousness: drowsy and responds to stimulation  Airway & Oxygen Therapy: Patient Spontanous Breathing and Patient connected to face mask oxygen  Post-op Assessment: Report given to RN, Post -op Vital signs reviewed and stable and Patient moving all extremities  Post vital signs: Reviewed and stable  Last Vitals:  Vitals Value Taken Time  BP 105/39 08/16/20 0853  Temp    Pulse 63 08/16/20 0854  Resp 16 08/16/20 0854  SpO2 97 % 08/16/20 0854  Vitals shown include unvalidated device data.  Last Pain:  Vitals:   08/16/20 0649  TempSrc: Oral  PainSc: 0-No pain         Complications: No notable events documented.

## 2020-08-16 NOTE — Op Note (Signed)
[facility] Patient Name: Ray Lewis Procedure Date: 08/16/2020 MRN: [id number] Attending MD: Justice Britain , MD Date of Birth: 1970-03-09 CSN: [id number] Age: 50 Admit Type: Outpatient Procedure:                Upper GI endoscopy Indications:              Esophageal varices, Follow-up of esophageal varices Providers:                Justice Britain, MD, Josie Dixon, RN, Ladona Ridgel, Technician, Caryl Pina CRNA Referring MD:             [place] Medicines:                Monitored Anesthesia Care Complications:            No immediate complications. Estimated Blood Loss:     Estimated blood loss was minimal. Procedure:                Pre-Anesthesia Assessment:                           - Prior to the procedure, a History and Physical                            was performed, and patient medications and                            allergies were reviewed. The patient's tolerance of                            previous anesthesia was also reviewed. The risks                            and benefits of the procedure and the sedation                            options and risks were discussed with the patient.                            All questions were answered, and informed consent                            was obtained. Prior Anticoagulants: The patient has                            taken no previous anticoagulant or antiplatelet                            agents. ASA Grade Assessment: III - A patient with                            severe systemic disease. After reviewing the risks  and benefits, the patient was deemed in                            satisfactory condition to undergo the procedure.                           After obtaining informed consent, the endoscope was                            passed under direct vision. Throughout the                             procedure, the patient's blood pressure, pulse, and                            oxygen saturations were monitored continuously. The                            [id number]-[id number] ([id number] Olympus gastroscope was                            introduced through the mouth, and advanced to the                            second part of duodenum. The upper GI endoscopy was                            accomplished without difficulty. The patient                            tolerated the procedure. Scope In: Scope Out: Findings:      No gross lesions were noted in the proximal esophagus and in the mid       esophagus.      Grade I, grade II varices were found in the distal esophagus.      The Z-line was irregular and was found 43 cm from the incisors.      Moderate portal hypertensive gastropathy was found in the entire       examined stomach.      Multiple dispersed small erosions with stigmata of recent small oozing       were found in the gastric body, at the incisura and in the gastric       antrum.      No other gross lesions (including gastric varices) were noted in the       entire examined stomach. Biopsies were taken with a cold forceps for       histology and Helicobacter pylori testing.      No gross lesions were noted in the duodenal bulb, in the first portion       of the duodenum and in the second portion of the duodenum. Impression:               - No gross lesions in esophagus proximally. Grade I                            and grade II esophageal varices  distally.                           - Z-line irregular, 43 cm from the incisors.                           - Portal hypertensive gastropathy. Erosive                            gastropathy with stigmata of recent oozing. No                            gastric varices. H. pylori biopsies obtained.                           - No gross lesions in the duodenal bulb, in the                            first portion of the duodenum and in the second                             portion of the duodenum. Moderate Sedation:      Not Applicable - Patient had care per Anesthesia. Recommendation:           - Proceed to scheduled colonoscopy.                           - Observe patient's clinical course.                           - Await pathology results.                           - Initiate Omeprazole 40 mg daily.                           - Minimize NSAIDs as able.                           - The findings and recommendations were discussed                            with the patient.                           - The findings and recommendations were discussed                            with the patient's family. Procedure Code(s):        --- Professional ---                           [phone], Esophagogastroduodenoscopy, flexible,                            transoral; with biopsy, single or multiple Diagnosis Code(s):        ---  Professional ---                           I85.00, Esophageal varices without bleeding                           K22.8, Other specified diseases of esophagus                           K76.6, Portal hypertension                           K31.89, Other diseases of stomach and duodenum                           K92.2, Gastrointestinal hemorrhage, unspecified CPT copyright 2019 American Medical Association. All rights reserved. The codes documented in this report are preliminary and upon coder review may  be revised to meet current compliance requirements. Justice Britain, MD 08/16/2020 8:54:33 AM Number of Addenda: 0

## 2020-08-17 ENCOUNTER — Encounter (HOSPITAL_COMMUNITY): Payer: Self-pay | Admitting: Gastroenterology

## 2020-08-17 ENCOUNTER — Encounter: Payer: Self-pay | Admitting: Gastroenterology

## 2020-08-17 LAB — SURGICAL PATHOLOGY

## 2020-08-31 DIAGNOSIS — K7460 Unspecified cirrhosis of liver: Secondary | ICD-10-CM | POA: Diagnosis not present

## 2020-08-31 DIAGNOSIS — I8500 Esophageal varices without bleeding: Secondary | ICD-10-CM | POA: Diagnosis not present

## 2020-08-31 DIAGNOSIS — F39 Unspecified mood [affective] disorder: Secondary | ICD-10-CM | POA: Diagnosis not present

## 2020-08-31 DIAGNOSIS — I10 Essential (primary) hypertension: Secondary | ICD-10-CM | POA: Diagnosis not present

## 2020-09-05 DIAGNOSIS — H524 Presbyopia: Secondary | ICD-10-CM | POA: Diagnosis not present

## 2020-09-05 DIAGNOSIS — H2513 Age-related nuclear cataract, bilateral: Secondary | ICD-10-CM | POA: Diagnosis not present

## 2020-09-24 IMAGING — DX DG CHEST 2V
2 series · 2 of 2 positions shown · non-contrast
Comparison: 03/05/2018

CLINICAL DATA: Acute onset right-sided chest pain and shortness of
breath.

EXAM:
CHEST - 2 VIEW

[chest pa]
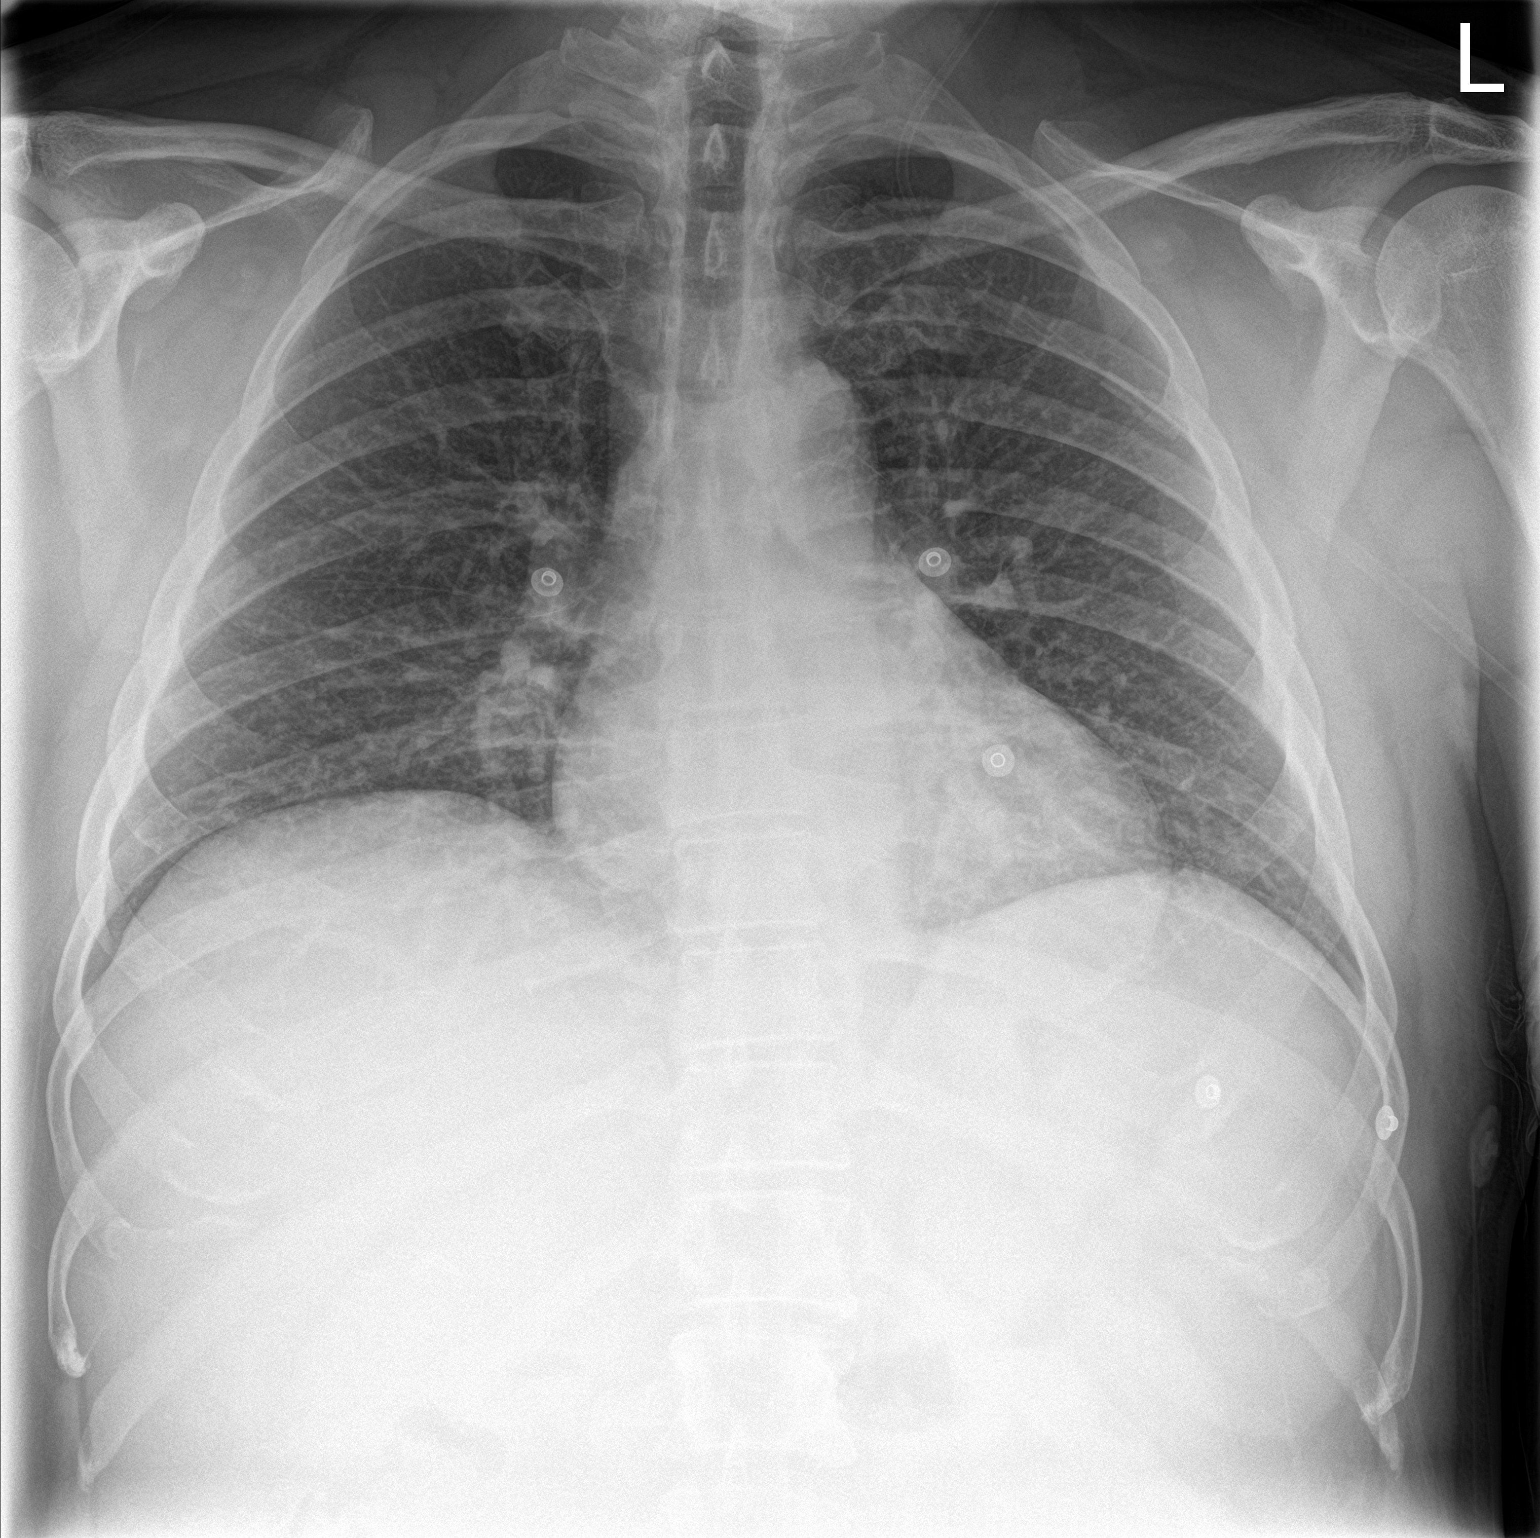

[chest lat]
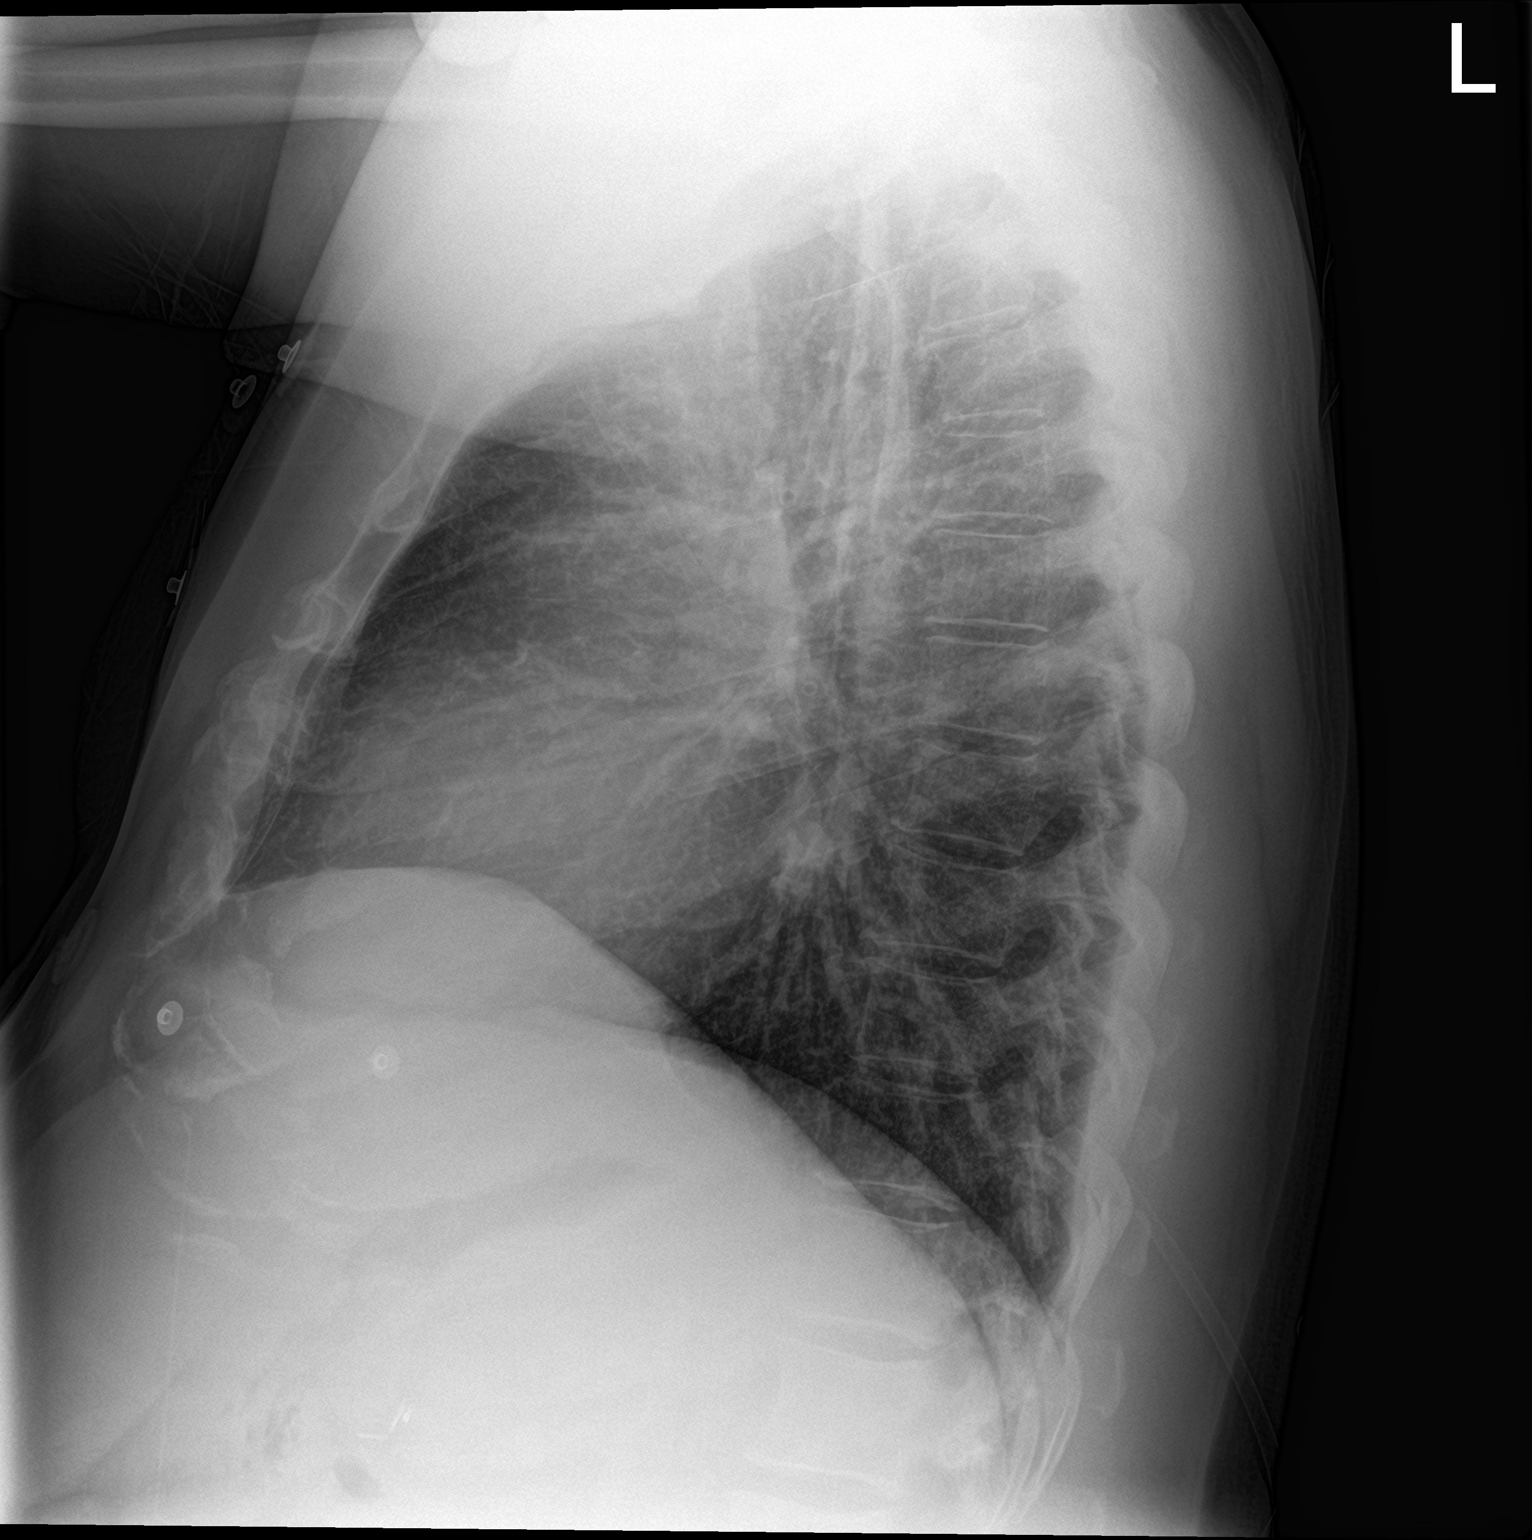

[2 of 2 positions shown; findings below may reference images not displayed]

FINDINGS: The heart size and mediastinal contours are within normal limits.
Both lungs are clear. The visualized skeletal structures are
unremarkable.
IMPRESSION: No active cardiopulmonary disease.

## 2020-10-04 DIAGNOSIS — H524 Presbyopia: Secondary | ICD-10-CM | POA: Diagnosis not present

## 2020-10-04 DIAGNOSIS — H53149 Visual discomfort, unspecified: Secondary | ICD-10-CM | POA: Diagnosis not present

## 2020-11-01 DIAGNOSIS — K7030 Alcoholic cirrhosis of liver without ascites: Secondary | ICD-10-CM | POA: Diagnosis not present

## 2020-11-01 DIAGNOSIS — K7460 Unspecified cirrhosis of liver: Secondary | ICD-10-CM | POA: Diagnosis not present

## 2020-12-22 DIAGNOSIS — Z23 Encounter for immunization: Secondary | ICD-10-CM | POA: Diagnosis not present

## 2020-12-22 DIAGNOSIS — Z6834 Body mass index (BMI) 34.0-34.9, adult: Secondary | ICD-10-CM | POA: Diagnosis not present

## 2020-12-22 DIAGNOSIS — Z125 Encounter for screening for malignant neoplasm of prostate: Secondary | ICD-10-CM | POA: Diagnosis not present

## 2020-12-22 DIAGNOSIS — Z0000 Encounter for general adult medical examination without abnormal findings: Secondary | ICD-10-CM | POA: Diagnosis not present

## 2021-01-16 DIAGNOSIS — I10 Essential (primary) hypertension: Secondary | ICD-10-CM | POA: Diagnosis not present

## 2021-01-16 DIAGNOSIS — R9431 Abnormal electrocardiogram [ECG] [EKG]: Secondary | ICD-10-CM | POA: Diagnosis not present

## 2021-01-16 DIAGNOSIS — R079 Chest pain, unspecified: Secondary | ICD-10-CM | POA: Diagnosis not present

## 2021-01-16 DIAGNOSIS — Z79899 Other long term (current) drug therapy: Secondary | ICD-10-CM | POA: Diagnosis not present

## 2021-01-16 DIAGNOSIS — E669 Obesity, unspecified: Secondary | ICD-10-CM | POA: Diagnosis not present

## 2021-01-16 DIAGNOSIS — E119 Type 2 diabetes mellitus without complications: Secondary | ICD-10-CM | POA: Diagnosis not present

## 2021-01-16 DIAGNOSIS — F17210 Nicotine dependence, cigarettes, uncomplicated: Secondary | ICD-10-CM | POA: Diagnosis not present

## 2021-01-16 DIAGNOSIS — I214 Non-ST elevation (NSTEMI) myocardial infarction: Secondary | ICD-10-CM | POA: Diagnosis not present

## 2021-01-16 DIAGNOSIS — K7030 Alcoholic cirrhosis of liver without ascites: Secondary | ICD-10-CM | POA: Diagnosis not present

## 2021-01-16 DIAGNOSIS — Z6830 Body mass index (BMI) 30.0-30.9, adult: Secondary | ICD-10-CM | POA: Diagnosis not present

## 2021-01-16 DIAGNOSIS — Z7982 Long term (current) use of aspirin: Secondary | ICD-10-CM | POA: Diagnosis not present

## 2021-01-16 DIAGNOSIS — I350 Nonrheumatic aortic (valve) stenosis: Secondary | ICD-10-CM | POA: Diagnosis not present

## 2021-01-16 DIAGNOSIS — D696 Thrombocytopenia, unspecified: Secondary | ICD-10-CM | POA: Diagnosis not present

## 2021-01-16 DIAGNOSIS — I491 Atrial premature depolarization: Secondary | ICD-10-CM | POA: Diagnosis not present

## 2021-01-23 DIAGNOSIS — K7030 Alcoholic cirrhosis of liver without ascites: Secondary | ICD-10-CM | POA: Diagnosis not present

## 2021-01-23 DIAGNOSIS — D696 Thrombocytopenia, unspecified: Secondary | ICD-10-CM | POA: Diagnosis not present

## 2021-01-23 DIAGNOSIS — I2510 Atherosclerotic heart disease of native coronary artery without angina pectoris: Secondary | ICD-10-CM | POA: Diagnosis not present

## 2021-01-23 DIAGNOSIS — E7439 Other disorders of intestinal carbohydrate absorption: Secondary | ICD-10-CM | POA: Diagnosis not present

## 2021-01-23 DIAGNOSIS — F17200 Nicotine dependence, unspecified, uncomplicated: Secondary | ICD-10-CM | POA: Diagnosis not present

## 2021-01-23 DIAGNOSIS — Z6834 Body mass index (BMI) 34.0-34.9, adult: Secondary | ICD-10-CM | POA: Diagnosis not present

## 2021-03-13 DIAGNOSIS — I110 Hypertensive heart disease with heart failure: Secondary | ICD-10-CM | POA: Diagnosis not present

## 2021-03-13 DIAGNOSIS — I2510 Atherosclerotic heart disease of native coronary artery without angina pectoris: Secondary | ICD-10-CM | POA: Diagnosis not present

## 2021-03-13 DIAGNOSIS — I10 Essential (primary) hypertension: Secondary | ICD-10-CM | POA: Diagnosis not present

## 2021-03-13 DIAGNOSIS — E781 Pure hyperglyceridemia: Secondary | ICD-10-CM | POA: Diagnosis not present

## 2021-03-13 DIAGNOSIS — I509 Heart failure, unspecified: Secondary | ICD-10-CM | POA: Diagnosis not present

## 2021-03-13 DIAGNOSIS — R778 Other specified abnormalities of plasma proteins: Secondary | ICD-10-CM | POA: Diagnosis not present

## 2021-03-13 DIAGNOSIS — I4891 Unspecified atrial fibrillation: Secondary | ICD-10-CM | POA: Diagnosis not present

## 2021-03-14 DIAGNOSIS — I493 Ventricular premature depolarization: Secondary | ICD-10-CM | POA: Diagnosis not present

## 2021-03-14 DIAGNOSIS — D696 Thrombocytopenia, unspecified: Secondary | ICD-10-CM | POA: Diagnosis not present

## 2021-03-14 DIAGNOSIS — I10 Essential (primary) hypertension: Secondary | ICD-10-CM | POA: Diagnosis not present

## 2021-03-14 DIAGNOSIS — I2510 Atherosclerotic heart disease of native coronary artery without angina pectoris: Secondary | ICD-10-CM | POA: Diagnosis not present

## 2021-03-14 DIAGNOSIS — I4891 Unspecified atrial fibrillation: Secondary | ICD-10-CM | POA: Diagnosis not present

## 2021-03-15 DIAGNOSIS — I480 Paroxysmal atrial fibrillation: Secondary | ICD-10-CM | POA: Diagnosis not present

## 2021-03-15 DIAGNOSIS — E781 Pure hyperglyceridemia: Secondary | ICD-10-CM | POA: Diagnosis not present

## 2021-03-15 DIAGNOSIS — I252 Old myocardial infarction: Secondary | ICD-10-CM | POA: Diagnosis not present

## 2021-03-15 DIAGNOSIS — I2510 Atherosclerotic heart disease of native coronary artery without angina pectoris: Secondary | ICD-10-CM | POA: Diagnosis not present

## 2021-03-15 DIAGNOSIS — R739 Hyperglycemia, unspecified: Secondary | ICD-10-CM | POA: Diagnosis not present

## 2021-03-15 DIAGNOSIS — I4891 Unspecified atrial fibrillation: Secondary | ICD-10-CM | POA: Diagnosis not present

## 2021-05-01 DIAGNOSIS — I350 Nonrheumatic aortic (valve) stenosis: Secondary | ICD-10-CM | POA: Diagnosis not present

## 2021-05-01 DIAGNOSIS — I480 Paroxysmal atrial fibrillation: Secondary | ICD-10-CM | POA: Diagnosis not present

## 2021-05-01 DIAGNOSIS — I252 Old myocardial infarction: Secondary | ICD-10-CM | POA: Diagnosis not present

## 2021-05-01 DIAGNOSIS — I2510 Atherosclerotic heart disease of native coronary artery without angina pectoris: Secondary | ICD-10-CM | POA: Diagnosis not present

## 2021-05-02 DIAGNOSIS — J069 Acute upper respiratory infection, unspecified: Secondary | ICD-10-CM | POA: Diagnosis not present

## 2021-05-02 DIAGNOSIS — I491 Atrial premature depolarization: Secondary | ICD-10-CM | POA: Diagnosis not present

## 2021-05-02 DIAGNOSIS — I480 Paroxysmal atrial fibrillation: Secondary | ICD-10-CM | POA: Diagnosis not present

## 2021-05-24 DIAGNOSIS — K7030 Alcoholic cirrhosis of liver without ascites: Secondary | ICD-10-CM | POA: Diagnosis not present

## 2021-05-24 DIAGNOSIS — F17290 Nicotine dependence, other tobacco product, uncomplicated: Secondary | ICD-10-CM | POA: Diagnosis not present

## 2021-05-24 DIAGNOSIS — Z7901 Long term (current) use of anticoagulants: Secondary | ICD-10-CM | POA: Diagnosis not present

## 2021-05-24 DIAGNOSIS — I4891 Unspecified atrial fibrillation: Secondary | ICD-10-CM | POA: Diagnosis not present

## 2021-09-12 DIAGNOSIS — I219 Acute myocardial infarction, unspecified: Secondary | ICD-10-CM | POA: Diagnosis not present

## 2021-09-12 DIAGNOSIS — Z23 Encounter for immunization: Secondary | ICD-10-CM | POA: Diagnosis not present

## 2021-09-12 DIAGNOSIS — I4891 Unspecified atrial fibrillation: Secondary | ICD-10-CM | POA: Diagnosis not present

## 2021-09-12 DIAGNOSIS — Z7901 Long term (current) use of anticoagulants: Secondary | ICD-10-CM | POA: Diagnosis not present

## 2021-09-12 DIAGNOSIS — I2510 Atherosclerotic heart disease of native coronary artery without angina pectoris: Secondary | ICD-10-CM | POA: Diagnosis not present

## 2021-10-17 DIAGNOSIS — R9431 Abnormal electrocardiogram [ECG] [EKG]: Secondary | ICD-10-CM | POA: Diagnosis not present

## 2021-10-17 DIAGNOSIS — I491 Atrial premature depolarization: Secondary | ICD-10-CM | POA: Diagnosis not present

## 2021-10-31 DIAGNOSIS — M4804 Spinal stenosis, thoracic region: Secondary | ICD-10-CM | POA: Diagnosis not present

## 2021-10-31 DIAGNOSIS — M488X4 Other specified spondylopathies, thoracic region: Secondary | ICD-10-CM | POA: Diagnosis not present

## 2021-11-07 ENCOUNTER — Encounter: Payer: Self-pay | Admitting: Gastroenterology

## 2021-11-27 DIAGNOSIS — D492 Neoplasm of unspecified behavior of bone, soft tissue, and skin: Secondary | ICD-10-CM | POA: Diagnosis not present

## 2021-12-06 ENCOUNTER — Other Ambulatory Visit: Payer: Self-pay | Admitting: *Deleted

## 2021-12-06 ENCOUNTER — Other Ambulatory Visit: Payer: Self-pay | Admitting: Neurosurgery

## 2021-12-06 DIAGNOSIS — D492 Neoplasm of unspecified behavior of bone, soft tissue, and skin: Secondary | ICD-10-CM

## 2021-12-19 ENCOUNTER — Institutional Professional Consult (permissible substitution) (INDEPENDENT_AMBULATORY_CARE_PROVIDER_SITE_OTHER): Payer: No Typology Code available for payment source | Admitting: Thoracic Surgery (Cardiothoracic Vascular Surgery)

## 2021-12-19 ENCOUNTER — Encounter: Payer: Self-pay | Admitting: Thoracic Surgery (Cardiothoracic Vascular Surgery)

## 2021-12-19 VITALS — BP 122/73 | HR 70 | Resp 20 | Ht 69.0 in | Wt 221.0 lb

## 2021-12-19 DIAGNOSIS — I252 Old myocardial infarction: Secondary | ICD-10-CM | POA: Diagnosis not present

## 2021-12-19 DIAGNOSIS — D492 Neoplasm of unspecified behavior of bone, soft tissue, and skin: Secondary | ICD-10-CM | POA: Diagnosis not present

## 2021-12-19 DIAGNOSIS — I4891 Unspecified atrial fibrillation: Secondary | ICD-10-CM | POA: Diagnosis not present

## 2021-12-19 DIAGNOSIS — I2510 Atherosclerotic heart disease of native coronary artery without angina pectoris: Secondary | ICD-10-CM | POA: Diagnosis not present

## 2021-12-19 DIAGNOSIS — Z7901 Long term (current) use of anticoagulants: Secondary | ICD-10-CM | POA: Diagnosis not present

## 2021-12-19 DIAGNOSIS — Z23 Encounter for immunization: Secondary | ICD-10-CM | POA: Diagnosis not present

## 2021-12-19 MED ORDER — GABAPENTIN 300 MG PO CAPS: 300.0000 mg | ORAL_CAPSULE | Freq: Every day | ORAL | 3 refills | Status: DC

## 2021-12-19 NOTE — H&P (View-Only) (Signed)
PCP is Angelina Sheriff, MD Referring Provider is Angelina Sheriff, MD  Chief Complaint  Patient presents with   spinal tumor    Surgical consult    HPI: Mr. Ray Lewis is sent for consultation regarding a posterior mediastinal mass.  Ray Lewis is a 51 year old man with a history of hypertension, hyperlipidemia, mild CAD, moderate aortic stenosis, atrial fibrillation, arthritis, ethanol abuse (quit 2 years ago), cirrhosis, esophageal varices, tobacco abuse, and a posterior mediastinal mass.  He was found to have a posterior mediastinal mass about 2 years ago.  This was felt likely to be benign.    He recently has been having some pain in his left posterior back.  He also has paresthesias radiating anteriorly from that area.  An MR and an outside institution showed the tumor had increased in size.  There is a small intraforaminal component.  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  No chest pain, pressure, tightness, or shortness of breath.  Zubrod Score: At the time of surgery this patient's most appropriate activity status/level should be described as: '[x]'$     0    Normal activity, no symptoms '[]'$     1    Restricted in physical strenuous activity but ambulatory, able to do out light work '[]'$     2    Ambulatory and capable of self care, unable to do work activities, up and about >50 % of waking hours                              '[]'$     3    Only limited self care, in bed greater than 50% of waking hours '[]'$     4    Completely disabled, no self care, confined to bed or chair '[]'$     5    Moribund  Past Medical History:  Diagnosis Date   Arthritis    Cirrhosis ([place])    Hyperlipidemia    Hypertension    Murmur    Sleep apnea     Past Surgical History:  Procedure Laterality Date   ANTERIOR CRUCIATE LIGAMENT REPAIR Right    x2 1998, 2010   APPENDECTOMY     BIOPSY  08/16/2020   Procedure: BIOPSY;  Surgeon: Irving Copas., MD;  Location: [facility];  Service: Gastroenterology;;   CHOLECYSTECTOMY     COLONOSCOPY WITH PROPOFOL N/A 08/16/2020   Procedure: COLONOSCOPY WITH PROPOFOL;  Surgeon: Irving Copas., MD;  Location: [facility];  Service: Gastroenterology;  Laterality: N/A;   ENDOSCOPIC MUCOSAL RESECTION  08/16/2020   Procedure: ENDOSCOPIC MUCOSAL RESECTION;  Surgeon: Rush Landmark Telford Nab., MD;  Location: [facility];  Service: Gastroenterology;;   ESOPHAGOGASTRODUODENOSCOPY (EGD) WITH PROPOFOL N/A 08/16/2020   Procedure: ESOPHAGOGASTRODUODENOSCOPY (EGD) WITH PROPOFOL;  Surgeon: Irving Copas., MD;  Location: [facility];  Service: Gastroenterology;  Laterality: N/A;   HEMOSTASIS CLIP PLACEMENT  08/16/2020   Procedure: HEMOSTASIS CLIP PLACEMENT;  Surgeon: Irving Copas., MD;  Location: [facility];  Service: Gastroenterology;;   POLYPECTOMY  08/16/2020   Procedure: POLYPECTOMY;  Surgeon: Irving Copas., MD;  Location: [facility];  Service: Gastroenterology;;   Maryagnes Amos INJECTION  08/16/2020   Procedure: SUBMUCOSAL LIFTING INJECTION;  Surgeon: Irving Copas., MD;  Location: [facility];  Service: Gastroenterology;;   UPPER ENDOSCOPY W/ BANDING      Family History  Problem Relation Age of Onset   Breast cancer  Mother    Kidney cancer Maternal Grandfather    Other Maternal Grandfather        brain tumor   Colon cancer Maternal Aunt    Colon cancer Maternal Aunt    Breast cancer Maternal Aunt    Breast cancer Maternal Aunt    Esophageal cancer Neg Hx    Inflammatory bowel disease Neg Hx    Liver disease Neg Hx    Pancreatic cancer Neg Hx    Stomach cancer Neg Hx     Social History Social History   Tobacco Use   Smoking status: Every Day    Packs/day: 1.00    Types: Cigarettes   Smokeless tobacco: Never  Vaping Use   Vaping Use: Never used  Substance Use Topics   Alcohol use: Not Currently   Drug use: Not Currently    Current Outpatient  Medications  Medication Sig Dispense Refill   ELIQUIS 5 MG TABS tablet Take 5 mg by mouth 2 (two) times daily.     gabapentin (NEURONTIN) 300 MG capsule Take 1 capsule (300 mg total) by mouth at bedtime. 30 capsule 3   lisinopril (ZESTRIL) 5 MG tablet Take 1 tablet by mouth daily.     Multiple Vitamin (MULTIVITAMIN WITH MINERALS) TABS tablet Take 1 tablet by mouth daily.     nitroGLYCERIN (NITROSTAT) 0.4 MG SL tablet Place 0.4 mg under the tongue every 5 (five) minutes as needed for chest pain.     SOTALOL AF 80 MG TABS Take 1 tablet by mouth 2 (two) times daily.     AMBULATORY NON FORMULARY MEDICATION Take 1 capsule by mouth daily. Medication Name: CBD Gummy-     atenolol (TENORMIN) 50 MG tablet Take 50 mg by mouth daily.     Cyanocobalamin (B-12 PO) Take 1 capsule by mouth daily.     omeprazole (PRILOSEC) 40 MG capsule Take 1 capsule (40 mg total) by mouth daily. 30 capsule 6   No current facility-administered medications for this visit.    No Known Allergies  Review of Systems  Constitutional:  Negative for activity change and fatigue.  HENT:  Negative for trouble swallowing and voice change.   Respiratory:  Positive for apnea (CPAP). Negative for shortness of breath.   Cardiovascular:  Positive for palpitations (Atrial fibrillation) and leg swelling (Right leg). Negative for chest pain.  Genitourinary:  Negative for difficulty urinating and dysuria.  Musculoskeletal:  Positive for arthralgias and back pain.       Avulsed tip of right index finger on tractor yesterday.  Neurological:  Negative for seizures and weakness.  Hematological:  Bruises/bleeds easily (Frequent nosebleeds).    BP 122/73   Pulse 70   Resp 20   Ht '5\' 9"'$  (1.753 m)   Wt 221 lb (100.2 kg)   SpO2 97% Comment: RA  BMI 32.64 kg/m  Physical Exam Vitals reviewed.  Constitutional:      General: He is not in acute distress.    Appearance: Normal appearance.  HENT:     Head: Normocephalic and atraumatic.   Eyes:     General: No scleral icterus.    Extraocular Movements: Extraocular movements intact.  Cardiovascular:     Rate and Rhythm: Normal rate and regular rhythm.     Heart sounds: Murmur (2/6 systolic) heard.  Pulmonary:     Effort: Pulmonary effort is normal. No respiratory distress.     Breath sounds: Normal breath sounds. No wheezing or rales.  Abdominal:     General:  There is no distension.     Palpations: Abdomen is soft.     Tenderness: There is no abdominal tenderness.  Musculoskeletal:        General: Deformity (Bandage right index finger) present.  Skin:    General: Skin is warm and dry.  Neurological:     General: No focal deficit present.     Mental Status: He is alert and oriented to person, place, and time.     Cranial Nerves: No cranial nerve deficit.     Motor: No weakness.      Diagnostic Tests: MR images reviewed via PACS system 5.3 cm mass at T7-T8 with small extension into the foramen  I reviewed cardiac catheterization report from November 2022 and echocardiogram report from January 2023 in Care everywhere-  Cath showed no hemodynamically significant stenoses Echocardiogram showed moderate aortic stenosis with a mean gradient 23 mmHg Normal ejection fraction  Impression: Ray Lewis is a 51 year old man with a history of hypertension, hyperlipidemia, mild CAD, moderate aortic stenosis, atrial fibrillation, arthritis, ethanol abuse (quit 2 years ago), cirrhosis, esophageal varices, tobacco abuse, and a posterior mediastinal mass.   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  Also could be neurofibroma.  Typically these are benign, but there have been reports of malignant tumors.  He has pain in the tumor has shown growth so resection is appropriate.  I discussed the potential options with both Dr. Arnoldo Morale and Mr. Griep.  Those include thoracotomy for resection, robotic VATS with possible thoracotomy to complete intraforaminal  component, and a staged procedure with a robotic VATS to restrict the extraforaminal portion of the mass followed by a posterior approach to remove the intraforaminal component.  Mr. Trevathan is strongly opposed to a staged procedure.  He wants to have a single procedure and have the tumor removed.  I recommended that we proceed with a robotic left VATS for resection of the posterior mediastinal mass.  We will do this in conjunction with Dr. Arnoldo Morale.  He understands there is a good possibility we will have to convert to a thoracotomy particularly to deal with the intraforaminal component of the tumor.  I informed him of the general nature of the procedure including the need for general anesthesia, the incisions to be used, the use of the surgical robot, the possible need for conversion, the use of drains to postoperatively, the expected hospital stay, and the overall recovery.  I informed him of the indications, risk, benefits, and alternatives.  He understands the risks include, but not limited to death, MI, DVT, PE, bleeding, possible need for transfusion, infection, cardiac arrhythmias, injury to neighboring structures, spinal fluid leak, as well as the need possibility of other unforeseeable complications.  Plan: Robotic left VATS, possible thoracotomy for resection of posterior mediastinal mass in conjunction with Dr. Newman Pies Stop Eliquis 5 days prior to procedure Start gabapentin 300 mg nightly 1 week prior to surgery  [name], MD [facility] [phone]

## 2021-12-19 NOTE — Progress Notes (Signed)
PCP is Angelina Sheriff, MD Referring Provider is Angelina Sheriff, MD  Chief Complaint  Patient presents with   spinal tumor    Surgical consult    HPI: Mr. Bayona is sent for consultation regarding a posterior mediastinal mass.  Hargis Vandyne is a 51 year old man with a history of hypertension, hyperlipidemia, mild CAD, moderate aortic stenosis, atrial fibrillation, arthritis, ethanol abuse (quit 2 years ago), cirrhosis, esophageal varices, tobacco abuse, and a posterior mediastinal mass.  He was found to have a posterior mediastinal mass about 2 years ago.  This was felt likely to be benign.    He recently has been having some pain in his left posterior back.  He also has paresthesias radiating anteriorly from that area.  An MR and an outside institution showed the tumor had increased in size.  There is a small intraforaminal component.  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  No chest pain, pressure, tightness, or shortness of breath.  Zubrod Score: At the time of surgery this patient's most appropriate activity status/level should be described as: '[x]'$     0    Normal activity, no symptoms '[]'$     1    Restricted in physical strenuous activity but ambulatory, able to do out light work '[]'$     2    Ambulatory and capable of self care, unable to do work activities, up and about >50 % of waking hours                              '[]'$     3    Only limited self care, in bed greater than 50% of waking hours '[]'$     4    Completely disabled, no self care, confined to bed or chair '[]'$     5    Moribund  Past Medical History:  Diagnosis Date   Arthritis    Cirrhosis ([place])    Hyperlipidemia    Hypertension    Murmur    Sleep apnea     Past Surgical History:  Procedure Laterality Date   ANTERIOR CRUCIATE LIGAMENT REPAIR Right    x2 1998, 2010   APPENDECTOMY     BIOPSY  08/16/2020   Procedure: BIOPSY;  Surgeon: Irving Copas., MD;  Location: [facility];  Service: Gastroenterology;;   CHOLECYSTECTOMY     COLONOSCOPY WITH PROPOFOL N/A 08/16/2020   Procedure: COLONOSCOPY WITH PROPOFOL;  Surgeon: Irving Copas., MD;  Location: [facility];  Service: Gastroenterology;  Laterality: N/A;   ENDOSCOPIC MUCOSAL RESECTION  08/16/2020   Procedure: ENDOSCOPIC MUCOSAL RESECTION;  Surgeon: Rush Landmark Telford Nab., MD;  Location: [facility];  Service: Gastroenterology;;   ESOPHAGOGASTRODUODENOSCOPY (EGD) WITH PROPOFOL N/A 08/16/2020   Procedure: ESOPHAGOGASTRODUODENOSCOPY (EGD) WITH PROPOFOL;  Surgeon: Irving Copas., MD;  Location: [facility];  Service: Gastroenterology;  Laterality: N/A;   HEMOSTASIS CLIP PLACEMENT  08/16/2020   Procedure: HEMOSTASIS CLIP PLACEMENT;  Surgeon: Irving Copas., MD;  Location: [facility];  Service: Gastroenterology;;   POLYPECTOMY  08/16/2020   Procedure: POLYPECTOMY;  Surgeon: Irving Copas., MD;  Location: [facility];  Service: Gastroenterology;;   Maryagnes Amos INJECTION  08/16/2020   Procedure: SUBMUCOSAL LIFTING INJECTION;  Surgeon: Irving Copas., MD;  Location: [facility];  Service: Gastroenterology;;   UPPER ENDOSCOPY W/ BANDING      Family History  Problem Relation Age of Onset   Breast cancer  Mother    Kidney cancer Maternal Grandfather    Other Maternal Grandfather        brain tumor   Colon cancer Maternal Aunt    Colon cancer Maternal Aunt    Breast cancer Maternal Aunt    Breast cancer Maternal Aunt    Esophageal cancer Neg Hx    Inflammatory bowel disease Neg Hx    Liver disease Neg Hx    Pancreatic cancer Neg Hx    Stomach cancer Neg Hx     Social History Social History   Tobacco Use   Smoking status: Every Day    Packs/day: 1.00    Types: Cigarettes   Smokeless tobacco: Never  Vaping Use   Vaping Use: Never used  Substance Use Topics   Alcohol use: Not Currently   Drug use: Not Currently    Current Outpatient  Medications  Medication Sig Dispense Refill   ELIQUIS 5 MG TABS tablet Take 5 mg by mouth 2 (two) times daily.     gabapentin (NEURONTIN) 300 MG capsule Take 1 capsule (300 mg total) by mouth at bedtime. 30 capsule 3   lisinopril (ZESTRIL) 5 MG tablet Take 1 tablet by mouth daily.     Multiple Vitamin (MULTIVITAMIN WITH MINERALS) TABS tablet Take 1 tablet by mouth daily.     nitroGLYCERIN (NITROSTAT) 0.4 MG SL tablet Place 0.4 mg under the tongue every 5 (five) minutes as needed for chest pain.     SOTALOL AF 80 MG TABS Take 1 tablet by mouth 2 (two) times daily.     AMBULATORY NON FORMULARY MEDICATION Take 1 capsule by mouth daily. Medication Name: CBD Gummy-     atenolol (TENORMIN) 50 MG tablet Take 50 mg by mouth daily.     Cyanocobalamin (B-12 PO) Take 1 capsule by mouth daily.     omeprazole (PRILOSEC) 40 MG capsule Take 1 capsule (40 mg total) by mouth daily. 30 capsule 6   No current facility-administered medications for this visit.    No Known Allergies  Review of Systems  Constitutional:  Negative for activity change and fatigue.  HENT:  Negative for trouble swallowing and voice change.   Respiratory:  Positive for apnea (CPAP). Negative for shortness of breath.   Cardiovascular:  Positive for palpitations (Atrial fibrillation) and leg swelling (Right leg). Negative for chest pain.  Genitourinary:  Negative for difficulty urinating and dysuria.  Musculoskeletal:  Positive for arthralgias and back pain.       Avulsed tip of right index finger on tractor yesterday.  Neurological:  Negative for seizures and weakness.  Hematological:  Bruises/bleeds easily (Frequent nosebleeds).    BP 122/73   Pulse 70   Resp 20   Ht '5\' 9"'$  (1.753 m)   Wt 221 lb (100.2 kg)   SpO2 97% Comment: RA  BMI 32.64 kg/m  Physical Exam Vitals reviewed.  Constitutional:      General: He is not in acute distress.    Appearance: Normal appearance.  HENT:     Head: Normocephalic and atraumatic.   Eyes:     General: No scleral icterus.    Extraocular Movements: Extraocular movements intact.  Cardiovascular:     Rate and Rhythm: Normal rate and regular rhythm.     Heart sounds: Murmur (2/6 systolic) heard.  Pulmonary:     Effort: Pulmonary effort is normal. No respiratory distress.     Breath sounds: Normal breath sounds. No wheezing or rales.  Abdominal:     General:  There is no distension.     Palpations: Abdomen is soft.     Tenderness: There is no abdominal tenderness.  Musculoskeletal:        General: Deformity (Bandage right index finger) present.  Skin:    General: Skin is warm and dry.  Neurological:     General: No focal deficit present.     Mental Status: He is alert and oriented to person, place, and time.     Cranial Nerves: No cranial nerve deficit.     Motor: No weakness.      Diagnostic Tests: MR images reviewed via PACS system 5.3 cm mass at T7-T8 with small extension into the foramen  I reviewed cardiac catheterization report from November 2022 and echocardiogram report from January 2023 in Care everywhere-  Cath showed no hemodynamically significant stenoses Echocardiogram showed moderate aortic stenosis with a mean gradient 23 mmHg Normal ejection fraction  Impression: Journee Kohen is a 51 year old man with a history of hypertension, hyperlipidemia, mild CAD, moderate aortic stenosis, atrial fibrillation, arthritis, ethanol abuse (quit 2 years ago), cirrhosis, esophageal varices, tobacco abuse, and a posterior mediastinal mass.   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  Also could be neurofibroma.  Typically these are benign, but there have been reports of malignant tumors.  He has pain in the tumor has shown growth so resection is appropriate.  I discussed the potential options with both Dr. Arnoldo Morale and Mr. Whack.  Those include thoracotomy for resection, robotic VATS with possible thoracotomy to complete intraforaminal  component, and a staged procedure with a robotic VATS to restrict the extraforaminal portion of the mass followed by a posterior approach to remove the intraforaminal component.  Mr. Marcotte is strongly opposed to a staged procedure.  He wants to have a single procedure and have the tumor removed.  I recommended that we proceed with a robotic left VATS for resection of the posterior mediastinal mass.  We will do this in conjunction with Dr. Arnoldo Morale.  He understands there is a good possibility we will have to convert to a thoracotomy particularly to deal with the intraforaminal component of the tumor.  I informed him of the general nature of the procedure including the need for general anesthesia, the incisions to be used, the use of the surgical robot, the possible need for conversion, the use of drains to postoperatively, the expected hospital stay, and the overall recovery.  I informed him of the indications, risk, benefits, and alternatives.  He understands the risks include, but not limited to death, MI, DVT, PE, bleeding, possible need for transfusion, infection, cardiac arrhythmias, injury to neighboring structures, spinal fluid leak, as well as the need possibility of other unforeseeable complications.  Plan: Robotic left VATS, possible thoracotomy for resection of posterior mediastinal mass in conjunction with Dr. Newman Pies Stop Eliquis 5 days prior to procedure Start gabapentin 300 mg nightly 1 week prior to surgery  [name], MD [facility] [phone]

## 2021-12-21 ENCOUNTER — Other Ambulatory Visit: Payer: Self-pay | Admitting: *Deleted

## 2021-12-21 ENCOUNTER — Encounter: Payer: Self-pay | Admitting: *Deleted

## 2021-12-21 DIAGNOSIS — D492 Neoplasm of unspecified behavior of bone, soft tissue, and skin: Secondary | ICD-10-CM

## 2021-12-28 ENCOUNTER — Ambulatory Visit: Payer: Self-pay | Admitting: Licensed Clinical Social Worker

## 2021-12-28 DIAGNOSIS — K7030 Alcoholic cirrhosis of liver without ascites: Secondary | ICD-10-CM | POA: Diagnosis not present

## 2021-12-28 NOTE — Patient Outreach (Signed)
  Care Coordination   12/28/2021 Name: Ray Lewis MRN: [id number] DOB: [birth year]   Care Coordination Outreach Attempts:  An unsuccessful telephone outreach was attempted today to offer the patient information about available care coordination services as a benefit of their health plan.   Follow Up Plan:  Additional outreach attempts will be made to offer the patient care coordination information and services.   Encounter Outcome:  No Answer  Care Coordination Interventions Activated:  No   Care Coordination Interventions:  Yes, provided    Lenor Derrick, MSW, [place]  Social Worker [facility] Care [facility]  [phone]

## 2022-01-01 ENCOUNTER — Other Ambulatory Visit (HOSPITAL_COMMUNITY): Payer: BC Managed Care – PPO

## 2022-01-03 ENCOUNTER — Telehealth: Payer: Self-pay

## 2022-01-03 NOTE — Patient Outreach (Signed)
  Care Coordination   Initial Visit Note   01/03/2022 Name: SILVINO SELMAN MRN: [id number] DOB: 01-14-1971  DARALD UZZLE is a 51 y.o. year old male who sees [place], Angelique Blonder, MD for primary care. I spoke with  Shearon Balo by phone today.  What matters to the patients health and wellness today?  Placed call to patient to review and offer [facility] care coordination program . Patient reports that he is doing well. Reports that he has rescheduled his AWV.   Denies any needs at this time.      SDOH assessments and interventions completed:  No     Care Coordination Interventions Activated:  No  Care Coordination Interventions:  No, not indicated   Follow up plan: No further intervention required.   Encounter Outcome:  Pt. Refused   Tomasa Rand, RN, BSN, CEN [facility] [employer] [phone]

## 2022-01-08 ENCOUNTER — Other Ambulatory Visit (HOSPITAL_COMMUNITY): Payer: BC Managed Care – PPO

## 2022-01-08 DIAGNOSIS — Z0000 Encounter for general adult medical examination without abnormal findings: Secondary | ICD-10-CM | POA: Diagnosis not present

## 2022-01-08 DIAGNOSIS — Z6832 Body mass index (BMI) 32.0-32.9, adult: Secondary | ICD-10-CM | POA: Diagnosis not present

## 2022-01-08 DIAGNOSIS — E785 Hyperlipidemia, unspecified: Secondary | ICD-10-CM | POA: Diagnosis not present

## 2022-01-08 DIAGNOSIS — Z131 Encounter for screening for diabetes mellitus: Secondary | ICD-10-CM | POA: Diagnosis not present

## 2022-01-09 ENCOUNTER — Encounter (HOSPITAL_COMMUNITY): Payer: Self-pay | Admitting: Thoracic Surgery (Cardiothoracic Vascular Surgery)

## 2022-01-09 ENCOUNTER — Other Ambulatory Visit: Payer: Self-pay

## 2022-01-09 NOTE — Progress Notes (Signed)
S.D.W- Instructions   Your procedure is scheduled on Thurs., Nov. 16, 2023 from 7:30AM-12: 51PM.  Report to [facility] Main Entrance "A" at 5:30 A.M., then check in with the Admitting office.  Call this number if you have problems the morning of surgery:  [phone]             If you experience any cold or flu symptoms such as cough, fever, chills, shortness of breath, etc. between now and your scheduled surgery, please notify us at the above         number.  Remember:  Do not eat or drink after midnight on Nov. 15th    Take these medicines the morning of surgery with A SIP OF WATER:  SOTALOL   Take last dose of Eliquis on 01/04/22  As of today, STOP taking any Aspirin (unless otherwise instructed by your surgeon) Aleve, Naproxen, Ibuprofen, Motrin, Advil, Goody's, BC's, all herbal medications, fish oil, and all vitamins.          Do not wear jewelry. Do not wear lotions, powders, cologne or deodorant. Do not shave 48 hours prior to surgery.  Men may shave face and neck. Do not bring valuables to the hospital.  [facility] is not responsible for any belongings or valuables.    Do NOT Smoke (Tobacco/Vaping)  24 hours prior to your procedure  If you use a CPAP at night, you may bring your mask for your overnight stay.   Contacts, glasses, hearing aids, dentures or partials may not be worn into surgery, please bring cases for these belongings   For patients admitted to the hospital, discharge time will be determined by your treatment team.   Patients discharged the day of surgery will not be allowed to drive home, and someone needs to stay with them for 24 hours.  Special instructions:    Oral Hygiene is also important to reduce your risk of infection.  Remember - BRUSH YOUR TEETH THE MORNING OF SURGERY WITH YOUR REGULAR TOOTHPASTE  [facility]- Preparing For Surgery  Before surgery, you can play an important role. Because skin is not sterile, your skin needs to be as free of  germs as possible. You can reduce the number of germs on your skin by washing with Antibacterial Soap before surgery.     Please follow these instructions carefully.     Shower the NIGHT BEFORE SURGERY and the MORNING OF SURGERY with Antibacterial Soap.   Pat yourself dry with a CLEAN TOWEL.  Wear CLEAN PAJAMAS to bed the night before surgery  Place CLEAN SHEETS on your bed the night before your surgery  DO NOT SLEEP WITH PETS.  Day of Surgery:  Take a shower with Antibacterial soap. Wear Clean/Comfortable clothing the morning of surgery Do not apply any deodorants/lotions.   Remember to brush your teeth WITH YOUR REGULAR TOOTHPASTE.   If you test positive for Covid, or been in contact with anyone that has tested positive in the last 10 days, please notify your surgeon.  SURGICAL WAITING ROOM VISITATION Patients having surgery or a procedure may have no more than 2 support people in the waiting area - these visitors may rotate.   Children under the age of 21 must have an adult with them who is not the patient. If the patient needs to stay at the hospital during part of their recovery, the visitor guidelines for inpatient rooms apply. Pre-op nurse will coordinate an appropriate time for 1 support person to accompany patient  in pre-op.  This support person may not rotate.   Please refer to the [facility] website for the visitor guidelines for Inpatients (after your surgery is over and you are in a regular room).

## 2022-01-09 NOTE — Anesthesia Preprocedure Evaluation (Deleted)
Anesthesia Evaluation    Airway        Dental   Pulmonary Current Smoker and Patient abstained from smoking.          Cardiovascular hypertension,      Neuro/Psych    GI/Hepatic   Endo/Other    Renal/GU      Musculoskeletal   Abdominal   Peds  Hematology   Anesthesia Other Findings   Reproductive/Obstetrics                              Anesthesia Physical Anesthesia Plan Anesthesia Quick Evaluation

## 2022-01-09 NOTE — Progress Notes (Signed)
PCP - Dr. Lin Landsman  [facility] - [facility] [place]  EP- Denies  Endocrine- Denies  Pulm- Denies  Chest x-ray - 01/10/22- Day of surgery  EKG - 01/10/22- Day of surgery  Stress Test - Denies  ECHO - 03/14/21 (E)  Cardiac Cath - Denies  AICD-na PM-na LOOP-na   Nerve Stimulator- Denies   Dialysis- Denies  Sleep Study - Yes- Positive CPAP - Yes- Pt will bring the day of surgery  LABS- 01/10/22: CBC, CMP, PT, PTT, ABG, T/S x2, PCR, UA 12/30/21: COVID- +(Positive)- Pt denies any symptoms.  ASA- LD- 11/14 ELIQUIS- LD 11/10  ERAS- No  HA1C- Denies  Anesthesia- No  Pt denies having chest pain, sob, or fever during the pre-op phone call. All instructions explained to the pt, with a verbal understanding of the material. Pt also instructed to wear a mask and social distance if he goes out. The opportunity to ask questions was provided.

## 2022-01-10 ENCOUNTER — Encounter (HOSPITAL_COMMUNITY)
Admission: RE | Disposition: A | Discharge status: Home / Self Care | Payer: Self-pay | Source: Home / Self Care | Attending: Thoracic Surgery (Cardiothoracic Vascular Surgery)

## 2022-01-10 ENCOUNTER — Inpatient Hospital Stay (HOSPITAL_COMMUNITY): Payer: No Typology Code available for payment source

## 2022-01-10 ENCOUNTER — Encounter (HOSPITAL_COMMUNITY): Payer: Self-pay | Admitting: Thoracic Surgery (Cardiothoracic Vascular Surgery)

## 2022-01-10 ENCOUNTER — Inpatient Hospital Stay (HOSPITAL_COMMUNITY): Payer: No Typology Code available for payment source | Admitting: Anesthesiology

## 2022-01-10 ENCOUNTER — Other Ambulatory Visit: Payer: Self-pay

## 2022-01-10 ENCOUNTER — Inpatient Hospital Stay (HOSPITAL_COMMUNITY)
Admission: RE | Admit: 2022-01-10 | Discharge: 2022-01-12 | DRG: 519 | Disposition: A | Discharge status: Home / Self Care | Payer: No Typology Code available for payment source | Attending: Thoracic Surgery (Cardiothoracic Vascular Surgery) | Admitting: Thoracic Surgery (Cardiothoracic Vascular Surgery)

## 2022-01-10 DIAGNOSIS — M6740 Ganglion, unspecified site: Secondary | ICD-10-CM | POA: Diagnosis present

## 2022-01-10 DIAGNOSIS — I10 Essential (primary) hypertension: Secondary | ICD-10-CM | POA: Diagnosis present

## 2022-01-10 DIAGNOSIS — R011 Cardiac murmur, unspecified: Secondary | ICD-10-CM | POA: Diagnosis present

## 2022-01-10 DIAGNOSIS — G4733 Obstructive sleep apnea (adult) (pediatric): Secondary | ICD-10-CM | POA: Diagnosis present

## 2022-01-10 DIAGNOSIS — F17210 Nicotine dependence, cigarettes, uncomplicated: Secondary | ICD-10-CM | POA: Diagnosis present

## 2022-01-10 DIAGNOSIS — Z820 Family history of epilepsy and other diseases of the nervous system: Secondary | ICD-10-CM

## 2022-01-10 DIAGNOSIS — Z9889 Other specified postprocedural states: Secondary | ICD-10-CM

## 2022-01-10 DIAGNOSIS — F1021 Alcohol dependence, in remission: Secondary | ICD-10-CM | POA: Diagnosis present

## 2022-01-10 DIAGNOSIS — K7030 Alcoholic cirrhosis of liver without ascites: Secondary | ICD-10-CM | POA: Diagnosis present

## 2022-01-10 DIAGNOSIS — R222 Localized swelling, mass and lump, trunk: Secondary | ICD-10-CM | POA: Diagnosis present

## 2022-01-10 DIAGNOSIS — D3610 Benign neoplasm of peripheral nerves and autonomic nervous system, unspecified: Secondary | ICD-10-CM

## 2022-01-10 DIAGNOSIS — E785 Hyperlipidemia, unspecified: Secondary | ICD-10-CM | POA: Diagnosis present

## 2022-01-10 DIAGNOSIS — I2510 Atherosclerotic heart disease of native coronary artery without angina pectoris: Secondary | ICD-10-CM | POA: Diagnosis present

## 2022-01-10 DIAGNOSIS — D696 Thrombocytopenia, unspecified: Secondary | ICD-10-CM | POA: Diagnosis present

## 2022-01-10 DIAGNOSIS — M5414 Radiculopathy, thoracic region: Secondary | ICD-10-CM | POA: Diagnosis present

## 2022-01-10 DIAGNOSIS — D62 Acute posthemorrhagic anemia: Secondary | ICD-10-CM | POA: Diagnosis present

## 2022-01-10 DIAGNOSIS — Z7901 Long term (current) use of anticoagulants: Secondary | ICD-10-CM

## 2022-01-10 DIAGNOSIS — G4730 Sleep apnea, unspecified: Secondary | ICD-10-CM

## 2022-01-10 DIAGNOSIS — F17200 Nicotine dependence, unspecified, uncomplicated: Secondary | ICD-10-CM

## 2022-01-10 DIAGNOSIS — Z800 Family history of malignant neoplasm of digestive organs: Secondary | ICD-10-CM

## 2022-01-10 DIAGNOSIS — D492 Neoplasm of unspecified behavior of bone, soft tissue, and skin: Secondary | ICD-10-CM | POA: Diagnosis present

## 2022-01-10 DIAGNOSIS — Z9049 Acquired absence of other specified parts of digestive tract: Secondary | ICD-10-CM

## 2022-01-10 DIAGNOSIS — Z803 Family history of malignant neoplasm of breast: Secondary | ICD-10-CM | POA: Diagnosis not present

## 2022-01-10 DIAGNOSIS — Z79899 Other long term (current) drug therapy: Secondary | ICD-10-CM

## 2022-01-10 DIAGNOSIS — Z86010 Personal history of colonic polyps: Secondary | ICD-10-CM

## 2022-01-10 DIAGNOSIS — Z8719 Personal history of other diseases of the digestive system: Secondary | ICD-10-CM | POA: Diagnosis not present

## 2022-01-10 DIAGNOSIS — Z8051 Family history of malignant neoplasm of kidney: Secondary | ICD-10-CM | POA: Diagnosis not present

## 2022-01-10 DIAGNOSIS — I4891 Unspecified atrial fibrillation: Secondary | ICD-10-CM | POA: Diagnosis present

## 2022-01-10 DIAGNOSIS — M1990 Unspecified osteoarthritis, unspecified site: Secondary | ICD-10-CM | POA: Diagnosis present

## 2022-01-10 DIAGNOSIS — I350 Nonrheumatic aortic (valve) stenosis: Secondary | ICD-10-CM | POA: Diagnosis present

## 2022-01-10 DIAGNOSIS — I252 Old myocardial infarction: Secondary | ICD-10-CM

## 2022-01-10 HISTORY — PX: INTERCOSTAL NERVE BLOCK: SHX5021

## 2022-01-10 HISTORY — PX: LAMINECTOMY: SHX219

## 2022-01-10 LAB — CBC
HCT: 41.6 % (ref 39.0–52.0)
Hemoglobin: 15.5 g/dL (ref 13.0–17.0)
MCH: 33.6 pg (ref 26.0–34.0)
MCHC: 37.3 g/dL — ABNORMAL HIGH (ref 30.0–36.0)
MCV: 90.2 fL (ref 80.0–100.0)
Platelets: 122 10*3/uL — ABNORMAL LOW (ref 150–400)
RBC: 4.61 MIL/uL (ref 4.22–5.81)
RDW: 12.1 % (ref 11.5–15.5)
WBC: 7.1 10*3/uL (ref 4.0–10.5)
nRBC: 0 % (ref 0.0–0.2)

## 2022-01-10 LAB — BLOOD GAS, ARTERIAL
Acid-base deficit: 1.1 mmol/L (ref 0.0–2.0)
Bicarbonate: 22.6 mmol/L (ref 20.0–28.0)
Drawn by: 31717
O2 Saturation: 99.2 %
Patient temperature: 37
pCO2 arterial: 34 mmHg (ref 32–48)
pH, Arterial: 7.43 (ref 7.35–7.45)
pO2, Arterial: 90 mmHg (ref 83–108)

## 2022-01-10 LAB — POCT I-STAT 7, (LYTES, BLD GAS, ICA,H+H)
Acid-base deficit: 4 mmol/L — ABNORMAL HIGH (ref 0.0–2.0)
Bicarbonate: 23.3 mmol/L (ref 20.0–28.0)
Calcium, Ion: 1.22 mmol/L (ref 1.15–1.40)
HCT: 39 % (ref 39.0–52.0)
Hemoglobin: 13.3 g/dL (ref 13.0–17.0)
O2 Saturation: 95 %
Potassium: 3.8 mmol/L (ref 3.5–5.1)
Sodium: 135 mmol/L (ref 135–145)
TCO2: 25 mmol/L (ref 22–32)
pCO2 arterial: 52.2 mmHg — ABNORMAL HIGH (ref 32–48)
pH, Arterial: 7.258 — ABNORMAL LOW (ref 7.35–7.45)
pO2, Arterial: 89 mmHg (ref 83–108)

## 2022-01-10 LAB — URINALYSIS, ROUTINE W REFLEX MICROSCOPIC
Bilirubin Urine: NEGATIVE
Glucose, UA: NEGATIVE mg/dL
Hgb urine dipstick: NEGATIVE
Ketones, ur: NEGATIVE mg/dL
Leukocytes,Ua: NEGATIVE
Nitrite: NEGATIVE
Protein, ur: NEGATIVE mg/dL
Specific Gravity, Urine: 1.012 (ref 1.005–1.030)
pH: 6 (ref 5.0–8.0)

## 2022-01-10 LAB — PROTIME-INR
INR: 1.2 (ref 0.8–1.2)
Prothrombin Time: 15.4 seconds — ABNORMAL HIGH (ref 11.4–15.2)

## 2022-01-10 LAB — COMPREHENSIVE METABOLIC PANEL
ALT: 31 U/L (ref 0–44)
AST: 36 U/L (ref 15–41)
Albumin: 3.7 g/dL (ref 3.5–5.0)
Alkaline Phosphatase: 86 U/L (ref 38–126)
Anion gap: 13 (ref 5–15)
BUN: 11 mg/dL (ref 6–20)
CO2: 19 mmol/L — ABNORMAL LOW (ref 22–32)
Calcium: 9 mg/dL (ref 8.9–10.3)
Chloride: 102 mmol/L (ref 98–111)
Creatinine, Ser: 0.86 mg/dL (ref 0.61–1.24)
GFR, Estimated: >60 mL/min (ref >60)
Glucose, Bld: 124 mg/dL — ABNORMAL HIGH (ref 70–99)
Potassium: 4 mmol/L (ref 3.5–5.1)
Sodium: 134 mmol/L — ABNORMAL LOW (ref 135–145)
Total Bilirubin: 1.7 mg/dL — ABNORMAL HIGH (ref 0.3–1.2)
Total Protein: 7.1 g/dL (ref 6.5–8.1)

## 2022-01-10 LAB — PREPARE RBC (CROSSMATCH)

## 2022-01-10 LAB — SURGICAL PCR SCREEN
MRSA, PCR: NEGATIVE
Staphylococcus aureus: NEGATIVE

## 2022-01-10 LAB — APTT: aPTT: 30 seconds (ref 24–36)

## 2022-01-10 LAB — ABO/RH: ABO/RH(D): O POS

## 2022-01-10 SURGERY — THORACOSCOPY, ROBOT-ASSISTED
Anesthesia: General | Site: Chest

## 2022-01-10 MED ORDER — PHENYLEPHRINE HCL-NACL 20-0.9 MG/250ML-% IV SOLN
INTRAVENOUS | Status: DC | PRN
  Administered 2022-01-10: 40 ug/min via INTRAVENOUS

## 2022-01-10 MED ORDER — ROCURONIUM BROMIDE 10 MG/ML (PF) SYRINGE
PREFILLED_SYRINGE | INTRAVENOUS | Status: AC
  Filled 2022-01-10: qty 10

## 2022-01-10 MED ORDER — CHLORHEXIDINE GLUCONATE 0.12 % MT SOLN
OROMUCOSAL | Status: AC
  Administered 2022-01-10: 15 mL
  Filled 2022-01-10: qty 15

## 2022-01-10 MED ORDER — LACTATED RINGERS IV SOLN: INTRAVENOUS | Status: DC | PRN

## 2022-01-10 MED ORDER — GABAPENTIN 300 MG PO CAPS
300.0000 mg | ORAL_CAPSULE | Freq: Every day | ORAL | Status: DC
  Administered 2022-01-10 – 2022-01-11 (×2): 300 mg via ORAL
  Filled 2022-01-10 (×2): qty 1

## 2022-01-10 MED ORDER — MIDAZOLAM HCL 2 MG/2ML IJ SOLN
INTRAMUSCULAR | Status: DC | PRN
  Administered 2022-01-10: 2 mg via INTRAVENOUS

## 2022-01-10 MED ORDER — LIDOCAINE 2% (20 MG/ML) 5 ML SYRINGE
INTRAMUSCULAR | Status: DC | PRN
  Administered 2022-01-10: 60 mg via INTRAVENOUS

## 2022-01-10 MED ORDER — PROPOFOL 10 MG/ML IV BOLUS
INTRAVENOUS | Status: AC
  Filled 2022-01-10: qty 20

## 2022-01-10 MED ORDER — IPRATROPIUM-ALBUTEROL 0.5-2.5 (3) MG/3ML IN SOLN
3.0000 mL | Freq: Four times a day (QID) | RESPIRATORY_TRACT | Status: DC
  Filled 2022-01-10: qty 3

## 2022-01-10 MED ORDER — DEXAMETHASONE SODIUM PHOSPHATE 10 MG/ML IJ SOLN
INTRAMUSCULAR | Status: AC
  Filled 2022-01-10: qty 1

## 2022-01-10 MED ORDER — ENOXAPARIN SODIUM 40 MG/0.4ML IJ SOSY: 40.0000 mg | PREFILLED_SYRINGE | INTRAMUSCULAR | Status: DC

## 2022-01-10 MED ORDER — BUPIVACAINE HCL (PF) 0.5 % IJ SOLN
INTRAMUSCULAR | Status: AC
  Filled 2022-01-10: qty 30

## 2022-01-10 MED ORDER — MIDAZOLAM HCL 2 MG/2ML IJ SOLN
INTRAMUSCULAR | Status: AC
  Filled 2022-01-10: qty 2

## 2022-01-10 MED ORDER — ONDANSETRON HCL 4 MG/2ML IJ SOLN
INTRAMUSCULAR | Status: DC | PRN
  Administered 2022-01-10: 4 mg via INTRAVENOUS

## 2022-01-10 MED ORDER — THROMBIN (RECOMBINANT) 20000 UNITS EX SOLR
CUTANEOUS | Status: AC
  Filled 2022-01-10: qty 20000

## 2022-01-10 MED ORDER — BACITRACIN ZINC 500 UNIT/GM EX OINT
TOPICAL_OINTMENT | CUTANEOUS | Status: AC
  Filled 2022-01-10: qty 28.35

## 2022-01-10 MED ORDER — SUGAMMADEX SODIUM 200 MG/2ML IV SOLN
INTRAVENOUS | Status: DC | PRN
  Administered 2022-01-10: 200 mg via INTRAVENOUS

## 2022-01-10 MED ORDER — ACETAMINOPHEN 500 MG PO TABS
1000.0000 mg | ORAL_TABLET | Freq: Once | ORAL | Status: AC
  Administered 2022-01-10: 1000 mg via ORAL
  Filled 2022-01-10: qty 2

## 2022-01-10 MED ORDER — SOTALOL HCL 80 MG PO TABS
80.0000 mg | ORAL_TABLET | Freq: Two times a day (BID) | ORAL | Status: DC
Start: 2022-01-11 — End: 2022-01-12
  Administered 2022-01-11 – 2022-01-12 (×3): 80 mg via ORAL
  Filled 2022-01-10 (×3): qty 1

## 2022-01-10 MED ORDER — ASPIRIN 81 MG PO TBEC
81.0000 mg | DELAYED_RELEASE_TABLET | Freq: Every day | ORAL | Status: DC
  Administered 2022-01-11 – 2022-01-12 (×2): 81 mg via ORAL
  Filled 2022-01-10 (×2): qty 1

## 2022-01-10 MED ORDER — THROMBIN (RECOMBINANT) 5000 UNITS EX SOLR
CUTANEOUS | Status: AC
  Filled 2022-01-10: qty 5000

## 2022-01-10 MED ORDER — CEFAZOLIN SODIUM-DEXTROSE 2-4 GM/100ML-% IV SOLN
2.0000 g | Freq: Three times a day (TID) | INTRAVENOUS | Status: AC
  Administered 2022-01-10 – 2022-01-11 (×2): 2 g via INTRAVENOUS
  Filled 2022-01-10 (×2): qty 100

## 2022-01-10 MED ORDER — CEFAZOLIN SODIUM 1 G IJ SOLR
INTRAMUSCULAR | Status: AC
  Filled 2022-01-10: qty 20

## 2022-01-10 MED ORDER — ENOXAPARIN SODIUM 40 MG/0.4ML IJ SOSY: 40.0000 mg | PREFILLED_SYRINGE | Freq: Every day | INTRAMUSCULAR | Status: DC

## 2022-01-10 MED ORDER — ONDANSETRON HCL 4 MG/2ML IJ SOLN
INTRAMUSCULAR | Status: AC
  Filled 2022-01-10: qty 2

## 2022-01-10 MED ORDER — FENTANYL CITRATE (PF) 100 MCG/2ML IJ SOLN: 25.0000 ug | INTRAMUSCULAR | Status: DC | PRN

## 2022-01-10 MED ORDER — ROCURONIUM BROMIDE 10 MG/ML (PF) SYRINGE
PREFILLED_SYRINGE | INTRAVENOUS | Status: DC | PRN
  Administered 2022-01-10 (×2): 30 mg via INTRAVENOUS
  Administered 2022-01-10 (×2): 20 mg via INTRAVENOUS
  Administered 2022-01-10: 70 mg via INTRAVENOUS
  Administered 2022-01-10: 20 mg via INTRAVENOUS
  Administered 2022-01-10: 10 mg via INTRAVENOUS

## 2022-01-10 MED ORDER — BISACODYL 5 MG PO TBEC
10.0000 mg | DELAYED_RELEASE_TABLET | Freq: Every day | ORAL | Status: DC
  Administered 2022-01-11: 10 mg via ORAL
  Filled 2022-01-10 (×2): qty 2

## 2022-01-10 MED ORDER — THROMBIN 5000 UNITS EX SOLR
CUTANEOUS | Status: AC
  Filled 2022-01-10: qty 10000

## 2022-01-10 MED ORDER — DEXAMETHASONE SODIUM PHOSPHATE 10 MG/ML IJ SOLN
INTRAMUSCULAR | Status: DC | PRN
  Administered 2022-01-10: 10 mg via INTRAVENOUS

## 2022-01-10 MED ORDER — CHLORHEXIDINE GLUCONATE CLOTH 2 % EX PADS: 6.0000 | MEDICATED_PAD | Freq: Once | CUTANEOUS | Status: DC

## 2022-01-10 MED ORDER — ONDANSETRON HCL 4 MG/2ML IJ SOLN: 4.0000 mg | Freq: Four times a day (QID) | INTRAMUSCULAR | Status: DC | PRN

## 2022-01-10 MED ORDER — HEMOSTATIC AGENTS (NO CHARGE) OPTIME
TOPICAL | Status: DC | PRN
  Administered 2022-01-10 (×3): 1 via TOPICAL

## 2022-01-10 MED ORDER — PANTOPRAZOLE SODIUM 40 MG PO TBEC
40.0000 mg | DELAYED_RELEASE_TABLET | Freq: Every day | ORAL | Status: DC
  Administered 2022-01-11 – 2022-01-12 (×2): 40 mg via ORAL
  Filled 2022-01-10 (×2): qty 1

## 2022-01-10 MED ORDER — CEFAZOLIN SODIUM-DEXTROSE 2-4 GM/100ML-% IV SOLN: 2.0000 g | INTRAVENOUS | Status: DC

## 2022-01-10 MED ORDER — FENTANYL CITRATE PF 50 MCG/ML IJ SOSY
25.0000 ug | PREFILLED_SYRINGE | INTRAMUSCULAR | Status: DC | PRN
  Administered 2022-01-10: 50 ug via INTRAVENOUS
  Filled 2022-01-10: qty 1

## 2022-01-10 MED ORDER — PROPOFOL 10 MG/ML IV BOLUS
INTRAVENOUS | Status: DC | PRN
  Administered 2022-01-10: 40 mg via INTRAVENOUS
  Administered 2022-01-10: 100 mg via INTRAVENOUS
  Administered 2022-01-10: 50 mg via INTRAVENOUS
  Administered 2022-01-10: 60 mg via INTRAVENOUS

## 2022-01-10 MED ORDER — PHENYLEPHRINE 80 MCG/ML (10ML) SYRINGE FOR IV PUSH (FOR BLOOD PRESSURE SUPPORT)
PREFILLED_SYRINGE | INTRAVENOUS | Status: AC
  Filled 2022-01-10: qty 10

## 2022-01-10 MED ORDER — OXYCODONE HCL 5 MG PO TABS
5.0000 mg | ORAL_TABLET | ORAL | Status: DC | PRN
  Administered 2022-01-10 – 2022-01-11 (×2): 10 mg via ORAL
  Filled 2022-01-10 (×2): qty 2

## 2022-01-10 MED ORDER — 0.9 % SODIUM CHLORIDE (POUR BTL) OPTIME
TOPICAL | Status: DC | PRN
  Administered 2022-01-10: 2000 mL

## 2022-01-10 MED ORDER — LIDOCAINE 2% (20 MG/ML) 5 ML SYRINGE
INTRAMUSCULAR | Status: AC
  Filled 2022-01-10: qty 5

## 2022-01-10 MED ORDER — SENNOSIDES-DOCUSATE SODIUM 8.6-50 MG PO TABS
1.0000 | ORAL_TABLET | Freq: Every day | ORAL | Status: DC
  Administered 2022-01-10: 1 via ORAL
  Filled 2022-01-10 (×2): qty 1

## 2022-01-10 MED ORDER — SODIUM CHLORIDE FLUSH 0.9 % IV SOLN
INTRAVENOUS | Status: DC | PRN
  Administered 2022-01-10: 80 mL

## 2022-01-10 MED ORDER — KETAMINE HCL 50 MG/5ML IJ SOSY
PREFILLED_SYRINGE | INTRAMUSCULAR | Status: AC
  Filled 2022-01-10: qty 5

## 2022-01-10 MED ORDER — KETOROLAC TROMETHAMINE 15 MG/ML IJ SOLN
15.0000 mg | Freq: Four times a day (QID) | INTRAMUSCULAR | Status: DC
  Administered 2022-01-10 – 2022-01-12 (×6): 15 mg via INTRAVENOUS
  Filled 2022-01-10 (×6): qty 1

## 2022-01-10 MED ORDER — ACETAMINOPHEN 160 MG/5ML PO SOLN: 1000.0000 mg | Freq: Four times a day (QID) | ORAL | Status: DC

## 2022-01-10 MED ORDER — PHENYLEPHRINE 80 MCG/ML (10ML) SYRINGE FOR IV PUSH (FOR BLOOD PRESSURE SUPPORT)
PREFILLED_SYRINGE | INTRAVENOUS | Status: DC | PRN
  Administered 2022-01-10 (×2): 160 ug via INTRAVENOUS
  Administered 2022-01-10 (×2): 80 ug via INTRAVENOUS

## 2022-01-10 MED ORDER — BUPIVACAINE-EPINEPHRINE (PF) 0.5% -1:200000 IJ SOLN
INTRAMUSCULAR | Status: AC
  Filled 2022-01-10: qty 30

## 2022-01-10 MED ORDER — LIDOCAINE-EPINEPHRINE 1 %-1:100000 IJ SOLN
INTRAMUSCULAR | Status: AC
  Filled 2022-01-10: qty 1

## 2022-01-10 MED ORDER — FENTANYL CITRATE (PF) 250 MCG/5ML IJ SOLN
INTRAMUSCULAR | Status: AC
  Filled 2022-01-10: qty 5

## 2022-01-10 MED ORDER — THROMBIN 5000 UNITS EX SOLR
OROMUCOSAL | Status: DC | PRN
  Administered 2022-01-10 (×3): 5 mL via TOPICAL

## 2022-01-10 MED ORDER — LISINOPRIL 10 MG PO TABS
5.0000 mg | ORAL_TABLET | Freq: Every day | ORAL | Status: DC
  Administered 2022-01-11 – 2022-01-12 (×2): 5 mg via ORAL
  Filled 2022-01-10 (×2): qty 1

## 2022-01-10 MED ORDER — ACETAMINOPHEN 500 MG PO TABS
1000.0000 mg | ORAL_TABLET | Freq: Four times a day (QID) | ORAL | Status: DC
  Administered 2022-01-10 – 2022-01-12 (×6): 1000 mg via ORAL
  Filled 2022-01-10 (×6): qty 2

## 2022-01-10 MED ORDER — ALBUTEROL SULFATE HFA 108 (90 BASE) MCG/ACT IN AERS
INHALATION_SPRAY | RESPIRATORY_TRACT | Status: DC | PRN
  Administered 2022-01-10: 6 via RESPIRATORY_TRACT

## 2022-01-10 MED ORDER — KETAMINE HCL 10 MG/ML IJ SOLN
INTRAMUSCULAR | Status: DC | PRN
  Administered 2022-01-10 (×3): 10 mg via INTRAVENOUS
  Administered 2022-01-10: 50 mg via INTRAVENOUS

## 2022-01-10 MED ORDER — SODIUM CHLORIDE 0.9 % IV SOLN
INTRAVENOUS | Status: AC | PRN
  Administered 2022-01-10: 1000 mL via INTRAMUSCULAR

## 2022-01-10 MED ORDER — BUPIVACAINE LIPOSOME 1.3 % IJ SUSP
INTRAMUSCULAR | Status: AC
  Filled 2022-01-10: qty 20

## 2022-01-10 MED ORDER — FENTANYL CITRATE (PF) 250 MCG/5ML IJ SOLN
INTRAMUSCULAR | Status: DC | PRN
  Administered 2022-01-10: 50 ug via INTRAVENOUS
  Administered 2022-01-10: 150 ug via INTRAVENOUS
  Administered 2022-01-10: 50 ug via INTRAVENOUS

## 2022-01-10 MED ORDER — CEFAZOLIN SODIUM-DEXTROSE 2-4 GM/100ML-% IV SOLN
2.0000 g | INTRAVENOUS | Status: AC
  Administered 2022-01-10 (×2): 2 g via INTRAVENOUS
  Filled 2022-01-10: qty 100

## 2022-01-10 MED ORDER — TRAMADOL HCL 50 MG PO TABS: 50.0000 mg | ORAL_TABLET | Freq: Four times a day (QID) | ORAL | Status: DC | PRN

## 2022-01-10 MED ORDER — THROMBIN 5000 UNITS EX SOLR
CUTANEOUS | Status: AC
  Filled 2022-01-10: qty 5000

## 2022-01-10 MED ORDER — ALBUMIN HUMAN 5 % IV SOLN: INTRAVENOUS | Status: DC | PRN

## 2022-01-10 SURGICAL SUPPLY — 145 items
APPLIER CLIP ROT 10 11.4 M/L (STAPLE)
BAG COUNTER SPONGE SURGICOUNT (BAG) ×3 IMPLANT
BAND RUBBER #18 3X1/16 STRL (MISCELLANEOUS) IMPLANT
BENZOIN TINCTURE PRP APPL 2/3 (GAUZE/BANDAGES/DRESSINGS) ×3 IMPLANT
BIT DRILL NEURO 2X3.1 SFT TUCH (MISCELLANEOUS) ×3 IMPLANT
BLADE CLIPPER SURG (BLADE) ×3 IMPLANT
BLADE SURG 11 STRL SS (BLADE) IMPLANT
BLADE ULTRA TIP 2M (BLADE) IMPLANT
BUR MATCHSTICK NEURO 3.0 LAGG (BURR) ×3 IMPLANT
BUR PRECISION FLUTE 6.0 (BURR) IMPLANT
CANISTER SUCT 3000ML PPV (MISCELLANEOUS) ×6 IMPLANT
CANNULA REDUC XI 12-8 STAPL (CANNULA) ×6
CANNULA REDUCER 12-8 DVNC XI (CANNULA) ×6 IMPLANT
CATH THORACIC 28FR (CATHETERS) IMPLANT
CLIP APPLIE ROT 10 11.4 M/L (STAPLE) IMPLANT
CLIP LIGATING HEMO O LOK GREEN (MISCELLANEOUS) IMPLANT
CLIP TI WIDE RED SMALL 6 (CLIP) IMPLANT
CLIP VESOCCLUDE MED 6/CT (CLIP) IMPLANT
CLSR STERI-STRIP ANTIMIC 1/2X4 (GAUZE/BANDAGES/DRESSINGS) IMPLANT
CNTNR URN SCR LID CUP LEK RST (MISCELLANEOUS) ×15 IMPLANT
CONN ST 1/4X3/8  BEN (MISCELLANEOUS)
CONN ST 1/4X3/8 BEN (MISCELLANEOUS) IMPLANT
CONT SPEC 4OZ STRL OR WHT (MISCELLANEOUS) ×30
COTTONBALL LRG STERILE PKG (GAUZE/BANDAGES/DRESSINGS) IMPLANT
COVER MAYO STAND STRL (DRAPES) IMPLANT
COVER SURGICAL LIGHT HANDLE (MISCELLANEOUS) IMPLANT
DEFOGGER SCOPE WARMER CLEARIFY (MISCELLANEOUS) ×3 IMPLANT
DERMABOND ADVANCED .7 DNX12 (GAUZE/BANDAGES/DRESSINGS) IMPLANT
DRAIN CHANNEL 28F RND 3/8 FF (WOUND CARE) IMPLANT
DRAIN CHANNEL 32F RND 10.7 FF (WOUND CARE) IMPLANT
DRAIN CONNECTOR BLAKE 1:1 (MISCELLANEOUS) IMPLANT
DRAPE ARM DVNC X/XI (DISPOSABLE) ×12 IMPLANT
DRAPE CAMERA VIDEO/LASER (DRAPES) IMPLANT
DRAPE COLUMN DVNC XI (DISPOSABLE) ×3 IMPLANT
DRAPE CV SPLIT W-CLR ANES SCRN (DRAPES) ×3 IMPLANT
DRAPE DA VINCI XI ARM (DISPOSABLE) ×12
DRAPE DA VINCI XI COLUMN (DISPOSABLE) ×3
DRAPE INCISE IOBAN 66X45 STRL (DRAPES) IMPLANT
DRAPE LAPAROTOMY 100X72 PEDS (DRAPES) IMPLANT
DRAPE LAPAROTOMY 100X72X124 (DRAPES) IMPLANT
DRAPE MICROSCOPE SLANT 54X150 (MISCELLANEOUS) IMPLANT
DRAPE ORTHO SPLIT 77X108 STRL (DRAPES) ×3
DRAPE SURG 17X23 STRL (DRAPES) ×12 IMPLANT
DRAPE SURG ORHT 6 SPLT 77X108 (DRAPES) ×3 IMPLANT
DRILL NEURO 2X3.1 SOFT TOUCH (MISCELLANEOUS) ×3
DRSG OPSITE POSTOP 4X6 (GAUZE/BANDAGES/DRESSINGS) IMPLANT
ELECT BLADE 4.0 EZ CLEAN MEGAD (MISCELLANEOUS) ×3
ELECT BLADE 6.5 EXT (BLADE) ×3 IMPLANT
ELECT REM PT RETURN 9FT ADLT (ELECTROSURGICAL) ×6
ELECTRODE BLDE 4.0 EZ CLN MEGD (MISCELLANEOUS) IMPLANT
ELECTRODE REM PT RTRN 9FT ADLT (ELECTROSURGICAL) ×6 IMPLANT
EVACUATOR 1/8 PVC DRAIN (DRAIN) IMPLANT
EVACUATOR SILICONE 100CC (DRAIN) IMPLANT
GAUZE 4X4 16PLY ~~LOC~~+RFID DBL (SPONGE) IMPLANT
GAUZE KITTNER 4X5 RF (MISCELLANEOUS) IMPLANT
GAUZE SPONGE 4X4 12PLY STRL (GAUZE/BANDAGES/DRESSINGS) ×6 IMPLANT
GLOVE BIO SURGEON STRL SZ 6 (GLOVE) IMPLANT
GLOVE BIO SURGEON STRL SZ8 (GLOVE) ×3 IMPLANT
GLOVE BIO SURGEON STRL SZ8.5 (GLOVE) ×3 IMPLANT
GLOVE BIOGEL PI IND STRL 6.5 (GLOVE) IMPLANT
GLOVE EXAM NITRILE XL STR (GLOVE) IMPLANT
GLOVE SS BIOGEL STRL SZ 7.5 (GLOVE) ×6 IMPLANT
GOWN STRL REUS W/ TWL LRG LVL3 (GOWN DISPOSABLE) ×6 IMPLANT
GOWN STRL REUS W/ TWL XL LVL3 (GOWN DISPOSABLE) ×6 IMPLANT
GOWN STRL REUS W/TWL 2XL LVL3 (GOWN DISPOSABLE) ×3 IMPLANT
GOWN STRL REUS W/TWL LRG LVL3 (GOWN DISPOSABLE) ×6
GOWN STRL REUS W/TWL XL LVL3 (GOWN DISPOSABLE) ×6
HEMOSTAT POWDER KIT SURGIFOAM (HEMOSTASIS) ×3 IMPLANT
HEMOSTAT SURGICEL 2X14 (HEMOSTASIS) ×6 IMPLANT
IRRIGATION STRYKERFLOW (MISCELLANEOUS) ×3 IMPLANT
IRRIGATOR STRYKERFLOW (MISCELLANEOUS) ×3
IRRIGATOR SUCT 8 DISP DVNC XI (IRRIGATION / IRRIGATOR) IMPLANT
IRRIGATOR SUCTION 8MM XI DISP (IRRIGATION / IRRIGATOR) ×3
IV NS 1000ML (IV SOLUTION) ×3
IV NS 1000ML BAXH (IV SOLUTION) ×3 IMPLANT
KIT BASIN OR (CUSTOM PROCEDURE TRAY) ×3 IMPLANT
KIT SUCTION CATH 14FR (SUCTIONS) IMPLANT
KIT TURNOVER KIT B (KITS) ×6 IMPLANT
MARKER SKIN DUAL TIP RULER LAB (MISCELLANEOUS) IMPLANT
NDL HYPO 21X1.5 SAFETY (NEEDLE) IMPLANT
NDL HYPO 25GX1X1/2 BEV (NEEDLE) ×3 IMPLANT
NDL SPNL 18GX3.5 QUINCKE PK (NEEDLE) IMPLANT
NDL SPNL 22GX3.5 QUINCKE BK (NEEDLE) ×3 IMPLANT
NEEDLE HYPO 21X1.5 SAFETY (NEEDLE) IMPLANT
NEEDLE HYPO 22GX1.5 SAFETY (NEEDLE) ×3 IMPLANT
NEEDLE HYPO 25GX1X1/2 BEV (NEEDLE) ×3 IMPLANT
NEEDLE SPNL 18GX3.5 QUINCKE PK (NEEDLE) IMPLANT
NEEDLE SPNL 22GX3.5 QUINCKE BK (NEEDLE) ×3 IMPLANT
NS IRRIG 1000ML POUR BTL (IV SOLUTION) ×6 IMPLANT
PACK CHEST (CUSTOM PROCEDURE TRAY) ×3 IMPLANT
PACK LAMINECTOMY NEURO (CUSTOM PROCEDURE TRAY) ×3 IMPLANT
PAD ARMBOARD 7.5X6 YLW CONV (MISCELLANEOUS) ×15 IMPLANT
PATTIES SURGICAL .25X.25 (GAUZE/BANDAGES/DRESSINGS) IMPLANT
PATTIES SURGICAL .5 X.5 (GAUZE/BANDAGES/DRESSINGS) IMPLANT
PATTIES SURGICAL .5 X3 (DISPOSABLE) IMPLANT
PATTIES SURGICAL 1/4 X 3 (GAUZE/BANDAGES/DRESSINGS) IMPLANT
PATTIES SURGICAL 1X1 (DISPOSABLE) IMPLANT
PORT ACCESS TROCAR AIRSEAL 12 (TROCAR) IMPLANT
SCISSORS LAP 5X35 DISP (ENDOMECHANICALS) IMPLANT
SEAL CANN UNIV 5-8 DVNC XI (MISCELLANEOUS) ×6 IMPLANT
SEAL XI 5MM-8MM UNIVERSAL (MISCELLANEOUS) ×12
SET TRI-LUMEN FLTR TB AIRSEAL (TUBING) ×3 IMPLANT
SPONGE INTESTINAL PEANUT (DISPOSABLE) IMPLANT
SPONGE NEURO XRAY DETECT 1X3 (DISPOSABLE) IMPLANT
SPONGE SURGIFOAM ABS GEL 100 (HEMOSTASIS) ×3 IMPLANT
SPONGE T-LAP 4X18 ~~LOC~~+RFID (SPONGE) IMPLANT
SPONGE TONSIL 1 RF SGL (DISPOSABLE) IMPLANT
SPONGE TONSIL 1.5 RFD TRIPLE (SPONGE) ×3 IMPLANT
STAPLER CANNULA SEAL DVNC XI (STAPLE) ×6 IMPLANT
STAPLER CANNULA SEAL XI (STAPLE)
STAPLER SKIN PROX WIDE 3.9 (STAPLE) ×3 IMPLANT
STRIP CLOSURE SKIN 1/2X4 (GAUZE/BANDAGES/DRESSINGS) ×3 IMPLANT
SUT ETHILON 3 0 FSL (SUTURE) ×3 IMPLANT
SUT NURALON 4 0 TR CR/8 (SUTURE) IMPLANT
SUT PDS AB 3-0 SH 27 (SUTURE) IMPLANT
SUT PROLENE 4 0 RB 1 (SUTURE)
SUT PROLENE 4-0 RB1 .5 CRCL 36 (SUTURE) IMPLANT
SUT PROLENE 5 0 C 1 24 (SUTURE) IMPLANT
SUT PROLENE 6 0 BV (SUTURE) IMPLANT
SUT SILK  1 MH (SUTURE) ×3
SUT SILK 1 MH (SUTURE) ×3 IMPLANT
SUT SILK 2 0 SH (SUTURE) IMPLANT
SUT SILK 2 0SH CR/8 30 (SUTURE) IMPLANT
SUT SILK 3 0 TIES 17X18 (SUTURE)
SUT SILK 3 0SH CR/8 30 (SUTURE) IMPLANT
SUT SILK 3-0 18XBRD TIE BLK (SUTURE) IMPLANT
SUT VIC AB 1 CT1 18XBRD ANBCTR (SUTURE) ×3 IMPLANT
SUT VIC AB 1 CT1 8-18 (SUTURE) ×3
SUT VIC AB 1 CTX 36 (SUTURE) ×3
SUT VIC AB 1 CTX36XBRD ANBCTR (SUTURE) ×3 IMPLANT
SUT VIC AB 2-0 CP2 18 (SUTURE) ×3 IMPLANT
SUT VIC AB 2-0 CTX 36 (SUTURE) ×3 IMPLANT
SUT VIC AB 3-0 X1 27 (SUTURE) ×6 IMPLANT
SUT VICRYL 0 TIES 12 18 (SUTURE) ×3 IMPLANT
SUT VICRYL 0 UR6 27IN ABS (SUTURE) ×6 IMPLANT
SUT VICRYL 2 TP 1 (SUTURE) IMPLANT
SYR 20CC LL (SYRINGE) ×6 IMPLANT
SYSTEM RETRIEVAL ANCHOR 8 (MISCELLANEOUS) IMPLANT
SYSTEM SAHARA CHEST DRAIN ATS (WOUND CARE) ×3 IMPLANT
TAPE CLOTH 4X10 WHT NS (GAUZE/BANDAGES/DRESSINGS) ×3 IMPLANT
TOWEL GREEN STERILE (TOWEL DISPOSABLE) ×6 IMPLANT
TOWEL GREEN STERILE FF (TOWEL DISPOSABLE) ×3 IMPLANT
TRAY FOLEY MTR SLVR 16FR STAT (SET/KITS/TRAYS/PACK) ×3 IMPLANT
TROCAR PORT AIRSEAL 12X150 (TUBING) ×3 IMPLANT
WATER STERILE IRR 1000ML POUR (IV SOLUTION) ×6 IMPLANT

## 2022-01-10 NOTE — Discharge Summary (Signed)
Physician Discharge Summary  Patient ID: Ray Lewis MRN: [id number] DOB/AGE: August 11, 1970 51 y.o.  Admit date: 01/10/2022 Discharge date: 01/12/2022  Admission Diagnoses:  Patient Active Problem List   Diagnosis Date Noted   Nerve sheath tumor 67/89/3810   Alcoholic cirrhosis of liver without ascites ([place]) 06/02/2020   Secondary esophageal varices with bleeding ([place]) 06/02/2020   Portal hypertension ([place]) 06/02/2020   Alcohol use disorder, severe, in early remission ([place]) 06/02/2020   History of alcoholic hepatitis 17/51/0258   Abnormal liver ultrasound 06/02/2020   Colon cancer screening 06/02/2020   Parotid mass 01/10/2020   Alcohol use disorder, severe, dependence ([place]) 12/21/2019   Thoracic spine tumor 11/02/2019   Atypical chest pain 03/10/2018   Smoking 03/10/2018   Dyslipidemia 03/10/2018   Essential hypertension 03/10/2018   Discharge Diagnoses:   Patient Active Problem List   Diagnosis Date Noted   S/P robot-assisted video thoracoscopy wtih resection of paraspinal mass 01/11/2022   Nerve sheath tumor 52/77/8242   Alcoholic cirrhosis of liver without ascites ([place]) 06/02/2020   Secondary esophageal varices with bleeding ([place]) 06/02/2020   Portal hypertension ([place]) 06/02/2020   Alcohol use disorder, severe, in early remission ([place]) 06/02/2020   History of alcoholic hepatitis 35/36/1443   Abnormal liver ultrasound 06/02/2020   Colon cancer screening 06/02/2020   Parotid mass 01/10/2020   Alcohol use disorder, severe, dependence ([place]) 12/21/2019   Thoracic spine tumor 11/02/2019   Atypical chest pain 03/10/2018   Smoking 03/10/2018   Dyslipidemia 03/10/2018   Essential hypertension 03/10/2018   Discharged Condition: good  PCP is Ray Sheriff, MD Referring Provider is Ray Sheriff, MD   HPI: At time of surgical consultation by Ray Lewis, the patient was also seen in neurosurgical consultation by Ray Lewis   Ray Lewis is sent for  consultation regarding a posterior mediastinal mass.   Ray Lewis is a 51 year old man with a history of hypertension, hyperlipidemia, mild CAD, moderate aortic stenosis, atrial fibrillation, arthritis, ethanol abuse (quit 2 years ago), cirrhosis, esophageal varices, tobacco abuse, and a posterior mediastinal mass.  He was found to have a posterior mediastinal mass about 2 years ago.  This was felt likely to be benign.     He recently has been having some pain in his left posterior back.  He also has paresthesias radiating anteriorly from that area.  An MR and an outside institution showed the tumor had increased in size.  There is a small intraforaminal component.   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  No chest pain, pressure, tightness, or shortness of breath.  Ray Lewis in [place] evaluated the patient and relevant studies and recommended proceeding with surgical resection.  He was admitted this hospitalization electively for the procedure.   Hospital course:  Patient was taken the operating room on 01/10/2022 at which time he underwent left robotic assisted thoracoscopic surgery for resection of spinal sheath tumor.  Subsequent to that he underwent posterior approach resection of small foraminal portion of the tumor by Ray Lewis.  He tolerated the procedure well was taken to the postanesthesia care unit in stable condition.  The patient did well post operatively.  His chest tube was kept on suction for 24 hours.  He had 300 cc output from chest tube.  This was removed on POD #1.  Follow up CXR showed no pleural effusion or pneumothorax.  He was evaluated by Ray Lewis and patient had the expected amount of thoracic  numbness.  He has been resumed on his home regimen of antihypertensives. His thoracic drain was removed without difficulty.  He is ambulating without difficulty.  His surgical incisions are healing without difficulty.  Per patient request,  prescription for pain is Percocet and sent to his pharmacy. Also, I discussed with neurosurgery NP when to restart Apixaban and patient instructed to restart Monday 01/14/2022. He is medically stable for discharge home today.       Consults:  Neurosurgery  Treatments:    NAME: Ray Lewis MEDICAL RECORD NO: [id number] ACCOUNT NO: [id number] DATE OF BIRTH: Aug 13, 1970 FACILITY[facility] LOCATION[facility] PHYSICIAN: Ray Standard. Roxan Hockey, MD   Operative Report    DATE OF PROCEDURE: 01/10/2022   PREOPERATIVE DIAGNOSIS:  Left paraspinal mass.   POSTOPERATIVE DIAGNOSIS:  Left paraspinal mass.   PROCEDURE:  Robotic left thoracoscopy, resection of paraspinal mass (in conjunction with Ray Lewis posterior approach) and intercostal nerve blocks levels 3 through 10.   SURGEON:  Ray Standard. Roxan Hockey, MD   ASSISTANT:  Ray Lewis.   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   Surgeon: Dr. Earle Lewis   Assistant: Ray Massy, NP    PATHOLOGY: Pending  Discharge Exam: Blood pressure (!) 108/57, pulse 65, temperature 98 F (36.7 C), temperature source Oral, resp. rate 19, height '5\' 9"'$  (1.753 m), weight 98 kg, SpO2 97 %. Cardiovascular: RRR Pulmonary: Clear to auscultation bilaterally Abdomen: Soft, non tender, bowel sounds present. Extremities: No lower extremity edema. Wounds: Clean and dry.  No erythema or signs of infection.  Disposition: Stable and discharged to home.      Allergies as of 01/12/2022   No Known Allergies      Medication List     TAKE these medications    aspirin EC 81 MG tablet Take 81 mg by mouth daily. Swallow whole.   Eliquis 5 MG Tabs tablet Generic drug: apixaban Take 1 tablet (5 mg total) by mouth 2 (two)  times daily. Start taking on: January 14, 2022 What changed: These instructions start on January 14, 2022. If you are unsure what to do until then, ask your doctor or other care provider.   gabapentin 300 MG capsule Commonly known as: Neurontin Take 1 capsule (300 mg total) by mouth at bedtime.   lisinopril 5 MG tablet Commonly known as: ZESTRIL Take 5 mg by mouth daily.   magnesium oxide 400 (240 Mg) MG tablet Commonly known as: MAG-OX Take 400 mg by mouth daily.   multivitamin with minerals Tabs tablet Take 1 tablet by mouth daily.   nitroGLYCERIN 0.4 MG SL tablet Commonly known as: NITROSTAT Place 0.4 mg under the tongue every 5 (five) minutes as needed for chest pain.   oxyCODONE-acetaminophen 5-325 MG tablet Commonly known as: Percocet Take 1 tablet by mouth every 6 (six) hours as needed for severe pain.   SOTALOL AF 80 MG Tabs Take 80 mg by mouth 2 (two) times daily.   VITAMIN K2-VITAMIN D3 PO Take 100 mg by mouth daily.        Follow-up Information     [name], MD Follow up on 01/29/2022.   Specialty: Cardiothoracic Surgery Why: Appointment is at 12:45, please get CXR at 12:15 at [facility] located on first floor of our office building Contact information: [address] [place] [address] [phone]         Newman Pies, MD. Call.   Specialty: Neurosurgery Why: for a follow up appointment for 2 weeks Contact information: [address] [place] [address] [phone]                 Signed: Arnoldo Lenis  01/12/2022, 9:59 AM

## 2022-01-10 NOTE — Anesthesia Postprocedure Evaluation (Signed)
Anesthesia Post Note  Patient: DETRELL UMSCHEID  Procedure(s) Performed: XI ROBOTIC ASSISTED THORACOSCOPY, RESECTION POSTERIOR MEDIASTINAL MASS (Left: Chest) INTERCOSTAL NERVE BLOCK THORACIC LAMINECTOMY T7-T8     Patient location during evaluation: PACU Anesthesia Type: General Level of consciousness: awake and alert Pain management: pain level controlled Vital Signs Assessment: post-procedure vital signs reviewed and stable Respiratory status: spontaneous breathing, nonlabored ventilation, respiratory function stable and patient connected to nasal cannula oxygen Cardiovascular status: blood pressure returned to baseline and stable Postop Assessment: no apparent nausea or vomiting Anesthetic complications: no  No notable events documented.  Last Vitals:  Vitals:   01/10/22 1445 01/10/22 1509  BP: (!) 140/76 129/71  Pulse: 74 73  Resp: 12 11  Temp:  36.7 C  SpO2: 96% 98%    Last Pain:  Vitals:   01/10/22 1509  TempSrc: Oral  PainSc:                  Tiajuana Amass

## 2022-01-10 NOTE — Transfer of Care (Signed)
Immediate Anesthesia Transfer of Care Note  Patient: Ray Lewis  Procedure(s) Performed: XI ROBOTIC ASSISTED THORACOSCOPY, RESECTION POSTERIOR MEDIASTINAL MASS (Left: Chest) INTERCOSTAL NERVE BLOCK THORACOTOMY FOR RESECTION OF T7-8 SPINAL TUMOR  Patient Location: PACU  Anesthesia Type:General  Level of Consciousness: awake, alert , and oriented  Airway & Oxygen Therapy: Patient Spontanous Breathing and Patient connected to face mask oxygen  Post-op Assessment: Report given to RN, Post -op Vital signs reviewed and stable, and Patient moving all extremities X 4  Post vital signs: Reviewed and stable  Last Vitals:  Vitals Value Taken Time  BP 131/72 01/10/22 1420  Temp    Pulse 81 01/10/22 1423  Resp 14 01/10/22 1423  SpO2 99 % 01/10/22 1423  Vitals shown include unvalidated device data.  Last Pain:  Vitals:   01/10/22 0637  TempSrc:   PainSc: 5       Patients Stated Pain Goal: 0 (57/26/20 3559)  Complications: No notable events documented.

## 2022-01-10 NOTE — Op Note (Signed)
Brief history: The patient is a 51 year old white male alcoholic with liver cirrhosis, atrial fibrillation, status post microinfarction, on Eliquis with a known thoracic tumor.  We initially observe this but it has enlarged and has become more symptomatic.  He is decided proceed with surgery.  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  Surgeon: Dr. Earle Gell  Assistant: Arnetha Massy, NP  Anesthesia: General tracheal  Estimated blood loss: 125 cc  Specimens: None  Drains: 1 medium Hemovac in the epidural space  Complications: None  Description of procedure: After Dr. Koleen Nimrod had completed the robot-assisted thoracotomy resection of the intrathoracic portion of the tumor, the patient was turned to the prone position on the chest rolls.  The patient's thoracic region was then prepared with Betadine scrub and Betadine solution.  Sterile drapes were applied.  I then injected the area to be incised with Marcaine with epinephrine solution.  I used a scalpel to make a midline incision over the T7-8 interspace.  I used electrocautery to perform a left sided subperiosteal dissection exposing the spinous process and left lamina at T7-8 and T8-9.  We obtained intraoperative radiograph to confirm our location.  I inserted the McCullough retractor for exposure.  I then used a high-speed drill to perform a left T7-8 laminotomy.  Brought the operative microscope into the field and under its magnification and illumination we completed the microdissection.  I performed a foraminotomy about the left T7-8 foramen.  The nerve root was enlarged as expected.  I then placed a 5-0 Prolene suture around the nerve root just as it exited the spine.  I then irritated the nerve root just distal to  the suture and then remove the dorsal root ganglion and the proximal nerve root.  I dissected deeper and encountered the Surgicel from Dr. Leonarda Salon resection.  I did not see any more tumor.  We obtained hemostasis with bipolar cautery.  I irrigated the wound out with bacitracin solution.  We placed Surgiflo the operative bed.  I then remove the retractor.  I placed a medium Hemovac drain in the epidural space and tunneled it out through a separate stab wound.  We then reapproximated the patient's thoracic fascia with interrupted number one 1 Vicryl suture.  We reapproximated the subcutaneous tissue with interrupted 2-0 Vicryl suture.  We approximated the skin with Steri-Strips and benzoin.  The wound was then coated with bacitracin ointment.  A sterile dressing was applied.  The drapes were removed.  By report all sponge, instrument, and needle counts were correct at the end of this case.

## 2022-01-10 NOTE — Op Note (Signed)
NAME: Ray Lewis, Ray Lewis MEDICAL RECORD NO: [id number] ACCOUNT NO: [id number] DATE OF BIRTH: 06-09-1970 FACILITY: [facility]ATION[facility] PHYSICIAN: Revonda Standard. Roxan Hockey, MD  Operative Report   DATE OF PROCEDURE: 01/10/2022  PREOPERATIVE DIAGNOSIS:  Left paraspinal mass.  POSTOPERATIVE DIAGNOSIS:  Left paraspinal mass.  PROCEDURE:  Robotic left thoracoscopy, resection of paraspinal mass (in conjunction with Dr. Arnoldo Morale posterior approach) and intercostal nerve blocks levels 3 through 10.  SURGEON:  Revonda Standard. Roxan Hockey, MD  CO SURGEON: Newman Pies, MD  ASSISTANT:  Jadene Pierini, PA-C.  ANESTHESIA:  General.  FINDINGS:  Large mass at T7-8 with involvement of intercostal nerve, grossly complete resection following Dr. Arnoldo Morale posterior approach.  CLINICAL NOTE:  Ray Lewis is a 51 year old man with a posterior mediastinal paraspinous mass.  Ray Lewis developed a left posterior back pain and paresthesias.  MRI showed an increase in size of his paraspinal tumor with a small intraforaminal component.  Ray Lewis  saw Dr. Arnoldo Morale, who recommended surgical resection in conjunction with a thoracic approach.  I saw Ray Lewis as well and after discussion with Ray Lewis and Dr. Arnoldo Morale, we opted for a combined robotic-assisted and posterior approach.  Ray  indications, risks, benefits, and alternatives were discussed in detail with Ray Lewis.  Ray Lewis understood and accepted Ray risks and agreed to proceed.  OPERATIVE NOTE:  Ray Lewis was brought to Ray preoperative holding area on 01/10/2022.  Anesthesia placed an arterial blood pressure monitoring line and established intravenous access.  Ray Lewis was taken to Ray operating room, anesthetized and intubated  with a double lumen endotracheal tube.  Intravenous antibiotics were administered.  A Foley catheter was placed.  Sequential compression devices were placed on Ray calves for DVT prophylaxis.  Ray Lewis was placed in a right lateral decubitus position and a Bair  hugger was placed for active warming.  Ray left chest was prepped and draped in Ray usual sterile fashion.  Single lung ventilation of Ray right lung was initiated and was tolerated well throughout Ray procedure.    A timeout was performed.  A solution containing 20 mL of liposomal bupivacaine, 30 mL of 0.5% bupivacaine and 50 mL of saline was prepared.  This solution was used for intercostal nerve blocks as well as for local at Ray incisions.  An incision was made  in Ray ninth interspace in approximately Ray midaxillary line.  Ray chest was bluntly entered using a hemostat.  An 8 mm robotic port was placed into Ray chest.  There was good isolation of Ray left lung.  Carbon dioxide was insufflated per protocol.   Inspection immediately revealed Ray paraspinous tumor in Ray mid portion of Ray chest posteriorly. Additional 8 mm robotic ports were placed in Ray eighth interspace 4 in all. Intercostal nerve blocks were performed from Ray third to Ray tenth interspace  by injecting 10 mL of Ray bupivacaine solution into a subpleural plane at each level.  A 12 mm AirSeal port was placed posteriorly.  Ray lung was retracted anteriorly.  Ray mass was not in close proximity to Ray aorta as suggested by Ray CT. Ray mass appeared well  encapsulated.  Ray pleura was incised around Ray mass and Ray mass was resected using a bipolar cautery.  As Ray mass was being removed, it was clear that there was involvement of Ray intercostal nerve, which was divided.  Ray intercostal vessels were  clipped and divided.  Ray majority of Ray mass was intrathoracic with a small intraforaminal component. Ray intrathoracic mass was completely mobilized  and then Ray mass was divided, leaving only Ray small intraforaminal component of Ray mass for Dr. Arnoldo Morale to remove from a posterior approach.  There was some bleeding where Ray tumor was transected and Surgicel was applied in that area.  After there was good hemostasis, Ray mass was  placed into an endoscopic retrieval bag and  brought down to Ray inferior aspect of Ray chest.  Ray sponges that had been placed were removed.  Surgicel was left in place as a guide for Dr. Arnoldo Morale from Ray posterior approach to when Ray Lewis had reached Ray intrathoracic component.  Ray robotic  instruments were removed.  Ray robot was undocked.  Ray mass was removed through Ray AirSeal port incision and sent for permanent pathology.  Chest was copiously irrigated with saline.  Final inspection was made for hemostasis.  A 28-French Blake drain  was placed through Ray anteriormost incision and secured with #1 silk suture.  Dual lung ventilation was resumed.  Ray incisions were closed in standard fashion.  Dermabond was applied.  Ray chest tube was placed to a Pleur-Evac on suction.  Ray Lewis  was placed back in a supine position and was reintubated with a single lumen endotracheal tube.  Dr. Arnoldo Morale then performed his portion of Ray procedure.  At Ray completion of Ray thoracic portion of Ray procedure, all sponge, needle and instrument  counts were correct.  Experienced assistance was necessary for this case due to surgical complexity.  Jadene Pierini, [place] served as Ray Environmental consultant providing assistance with port placement, instrument exchange, suctioning, specimen retrieval, and wound closure.   NIK D: 01/10/2022 6:02:49 pm T: 01/10/2022 11:53:00 pm  JOB: [id number]/ [id number]

## 2022-01-10 NOTE — H&P (Signed)
Subjective: The patient is a 51 year old white male alcoholic with liver cirrhosis, status post myocardial infarction atrial fibrillation on Eliquis who had been following secondary to a thoracic nerve sheath tumor.  The tumor has enlarged.  We discussed the various treatment options including surgery.  The patient has elected to have a robotic assisted thoracotomy for resection of tumor with Dr. Roxan Hockey.  We discussed the fact that the entire tumor may not be able to be resected by this approach and he likely will also require a thoracic laminectomy to remove the intraspinal portion of the tumor.  I described the surgery to him and the risk.  He wants to proceed with surgery as above.  Past Medical History:  Diagnosis Date   Arthritis    Cirrhosis ([place])    Hyperlipidemia    Hypertension    Murmur    Sleep apnea    Wears Cpap    Past Surgical History:  Procedure Laterality Date   ANTERIOR CRUCIATE LIGAMENT REPAIR Right    x2 1998, 2010   APPENDECTOMY     BIOPSY  08/16/2020   Procedure: BIOPSY;  Surgeon: Irving Copas., MD;  Location: [facility];  Service: Gastroenterology;;   CHOLECYSTECTOMY     COLONOSCOPY WITH PROPOFOL N/A 08/16/2020   Procedure: COLONOSCOPY WITH PROPOFOL;  Surgeon: Irving Copas., MD;  Location: [facility];  Service: Gastroenterology;  Laterality: N/A;   ENDOSCOPIC MUCOSAL RESECTION  08/16/2020   Procedure: ENDOSCOPIC MUCOSAL RESECTION;  Surgeon: Rush Landmark Telford Nab., MD;  Location: [facility];  Service: Gastroenterology;;   ESOPHAGOGASTRODUODENOSCOPY (EGD) WITH PROPOFOL N/A 08/16/2020   Procedure: ESOPHAGOGASTRODUODENOSCOPY (EGD) WITH PROPOFOL;  Surgeon: Irving Copas., MD;  Location: [facility];  Service: Gastroenterology;  Laterality: N/A;   HEMOSTASIS CLIP PLACEMENT  08/16/2020   Procedure: HEMOSTASIS CLIP PLACEMENT;  Surgeon: Irving Copas., MD;  Location: [facility];  Service: Gastroenterology;;   POLYPECTOMY   08/16/2020   Procedure: POLYPECTOMY;  Surgeon: Irving Copas., MD;  Location: [facility];  Service: Gastroenterology;;   Lia Foyer LIFTING INJECTION  08/16/2020   Procedure: SUBMUCOSAL LIFTING INJECTION;  Surgeon: Irving Copas., MD;  Location: [facility];  Service: Gastroenterology;;   UPPER ENDOSCOPY W/ BANDING      No Known Allergies  Social History   Tobacco Use   Smoking status: Every Day    Types: Cigars   Smokeless tobacco: Never  Substance Use Topics   Alcohol use: Not Currently    Comment: No alcohol    Family History  Problem Relation Age of Onset   Breast cancer Mother    Kidney cancer Maternal Grandfather    Other Maternal Grandfather        brain tumor   Colon cancer Maternal Aunt    Colon cancer Maternal Aunt    Breast cancer Maternal Aunt    Breast cancer Maternal Aunt    Esophageal cancer Neg Hx    Inflammatory bowel disease Neg Hx    Liver disease Neg Hx    Pancreatic cancer Neg Hx    Stomach cancer Neg Hx    Prior to Admission medications   Medication Sig Start Date End Date Taking? Authorizing Provider  aspirin EC 81 MG tablet Take 81 mg by mouth daily. Swallow whole.   Yes [provider]  ELIQUIS 5 MG TABS tablet Take 5 mg by mouth 2 (two) times daily. 12/06/21  Yes [provider]  gabapentin (NEURONTIN) 300 MG capsule Take 1 capsule (300 mg total) by mouth at bedtime.  12/19/21  Yes [name], MD  lisinopril (ZESTRIL) 5 MG tablet Take 5 mg by mouth daily. 05/01/21  Yes [provider]  magnesium oxide (MAG-OX) 400 (240 Mg) MG tablet Take 400 mg by mouth daily.   Yes [provider]  Multiple Vitamin (MULTIVITAMIN WITH MINERALS) TABS tablet Take 1 tablet by mouth daily.   Yes [provider]  SOTALOL AF 80 MG TABS Take 80 mg by mouth 2 (two) times daily. 03/23/21  Yes [provider]  Vitamin D-Vitamin K (VITAMIN K2-VITAMIN D3 PO) Take 100 mg by mouth daily.   Yes  [provider]  nitroGLYCERIN (NITROSTAT) 0.4 MG SL tablet Place 0.4 mg under the tongue every 5 (five) minutes as needed for chest pain.    [provider]     Review of Systems  Positive ROS: As above  All other systems have been reviewed and were otherwise negative with the exception of those mentioned in the HPI and as above.  Objective: Vital signs in last 24 hours: Temp:  [98.5 F (36.9 C)] 98.5 F (36.9 C) (11/16 0635) Pulse Rate:  [59] 59 (11/16 0635) Resp:  [18] 18 (11/16 0635) BP: (136)/(70) 136/70 (11/16 0635) Weight:  [98 kg] 98 kg (11/15 1105) Estimated body mass index is 31.9 kg/m as calculated from the following:   Height as of this encounter: '5\' 9"'$  (1.753 m).   Weight as of this encounter: 98 kg.   General Appearance: Alert Head: Normocephalic, without obvious abnormality, atraumatic Eyes: PERRL, conjunctiva/corneas clear, EOM's intact,    Ears: Normal  Throat: Normal  Neck: Supple, Back: unremarkable Lungs: Clear to auscultation bilaterally, respirations unlabored Heart: Regular rate and rhythm, no murmur, rub or gallop Abdomen: Soft, non-tender Extremities: Extremities normal, atraumatic, no cyanosis or edema Skin: unremarkable  NEUROLOGIC:   Mental status: alert and oriented,Motor Exam - grossly normal Sensory Exam - grossly normal Reflexes:  Coordination - grossly normal Gait - grossly normal Balance - grossly normal Cranial Nerves: I: smell Not tested  II: visual acuity  OS: Normal  OD: Normal   II: visual fields Full to confrontation  II: pupils Equal, round, reactive to light  III,VII: ptosis None  III,IV,VI: extraocular muscles  Full ROM  V: mastication Normal  V: facial light touch sensation  Normal  V,VII: corneal reflex  Present  VII: facial muscle function - upper  Normal  VII: facial muscle function - lower Normal  VIII: hearing Not tested  IX: soft palate elevation  Normal  IX,X: gag reflex Present  XI:  trapezius strength  5/5  XI: sternocleidomastoid strength 5/5  XI: neck flexion strength  5/5  XII: tongue strength  Normal    Data Review Lab Results  Component Value Date   WBC 7.1 01/10/2022   HGB 15.5 01/10/2022   HCT 41.6 01/10/2022   MCV 90.2 01/10/2022   PLT 122 (L) 01/10/2022   Lab Results  Component Value Date   NA 134 (L) 01/10/2022   K 4.0 01/10/2022   CL 102 01/10/2022   CO2 19 (L) 01/10/2022   BUN 11 01/10/2022   CREATININE 0.86 01/10/2022   GLUCOSE 124 (H) 01/10/2022   Lab Results  Component Value Date   INR 1.2 01/10/2022    Assessment/Plan: Thoracic tumor: I have discussed situation with the patient.  I reviewed his imaging studies with him and pointed out the abnormalities.  We have discussed the various treatment options including surgery.  Dr. Roxan Hockey has discussed with him  antibiotic assisted removal of the thoracic portion of the tumor.  If all the tumor cannot be removed via this approach then we will also need a thoracic laminectomy for resection of the intraspinal portion of the tumor.  I described the portion of the surgery to the patient.  We discussed the risk of greater than 7 seizures, hemorrhage, infection, nerve injury, spinal fluid leak, incomplete tumor resection, medical risk, etc.  I have answered all his questions.  He wants to proceed with surgery.     Ophelia Charter 01/10/2022 7:36 AM

## 2022-01-10 NOTE — Progress Notes (Signed)
Subjective: The patient is somnolent but easily arousable.  He is in no apparent distress.  Objective: Vital signs in last 24 hours: Temp:  [98.5 F (36.9 C)-98.6 F (37 C)] 98.6 F (37 C) (11/16 1420) Pulse Rate:  [59-78] 74 (11/16 1445) Resp:  [10-18] 12 (11/16 1445) BP: (131-140)/(70-76) 140/76 (11/16 1445) SpO2:  [96 %-98 %] 96 % (11/16 1445) Arterial Line BP: (125-159)/(69-77) 156/69 (11/16 1445) Estimated body mass index is 31.9 kg/m as calculated from the following:   Height as of this encounter: '5\' 9"'$  (1.753 m).   Weight as of this encounter: 98 kg.   Intake/Output from previous day: No intake/output data recorded. Intake/Output this shift: Total I/O In: 2900 [I.V.:2400; IV Piggyback:500] Out: 1200 [Urine:400; Blood:800]  Physical exam the patient is somnolent but easily arousable.  His lower extremity strength is normal.  Lab Results: Recent Labs    01/10/22 0609  WBC 7.1  HGB 15.5  HCT 41.6  PLT 122*   BMET Recent Labs    01/10/22 0610  NA 134*  K 4.0  CL 102  CO2 19*  GLUCOSE 124*  BUN 11  CREATININE 0.86  CALCIUM 9.0    Studies/Results: DG Thoracic Spine 2 View  Result Date: 01/10/2022 CLINICAL DATA:  Elective surgery.  Thoracic tumor resection. EXAM: THORACIC SPINE 2 VIEWS COMPARISON:  Thoracic spine radiographs earlier today FINDINGS: Two intraoperative radiographs are provided. On the first image, tissue retractors are in place and a surgical instrument projects over the T8 level. On the second image, a surgical instrument projects over the inferior T7 level. IMPRESSION: Intraoperative images during thoracic surgery. Electronically Signed   By: Logan Bores M.D.   On: 01/10/2022 14:17   DG THORACOLUMABAR SPINE  Result Date: 01/10/2022 CLINICAL DATA:  Paraspinal mass resection EXAM: THORACOLUMBAR SPINE 1V COMPARISON:  CT 12/15/2019 FINDINGS: Images obtained intraoperatively for localization. Image 1: Surgical instrument projects at the level  of the T9 inferior endplate. Image 2: Surgical instrument projects at the level of the T8 inferior endplate. Image 3: Surgical instrument projects at the level of the T8 inferior endplate. IMPRESSION: Intraoperative localization for thoracic surgery, as above. Electronically Signed   By: Davina Poke D.O.   On: 01/10/2022 12:48   DG Chest 2 View  Result Date: 01/10/2022 CLINICAL DATA:  Preop for thoracic spine tumor EXAM: CHEST - 2 VIEW COMPARISON:  03/01/2019 FINDINGS: Known left paraspinal mass seen on the frontal radiograph. There is no edema, consolidation, effusion, or pneumothorax. Normal heart size and mediastinal contours. IMPRESSION: 1. No evidence of active disease. 2. Known left paraspinal mass. Electronically Signed   By: Jorje Guild M.D.   On: 01/10/2022 06:17    Assessment/Plan: Status post thoracotomy and laminectomy for resection of nerve sheath tumor: The patient is doing well.  LOS: 0 days     Ophelia Charter 01/10/2022, 2:49 PM

## 2022-01-10 NOTE — Anesthesia Preprocedure Evaluation (Signed)
Anesthesia Evaluation  Patient identified by MRN, date of birth, ID band Patient awake    Reviewed: Allergy & Precautions, NPO status , Patient's Chart, lab work & pertinent test results  Airway Mallampati: II  TM Distance: >3 FB Neck ROM: Full    Dental   Pulmonary sleep apnea , Current Smoker   breath sounds clear to auscultation       Cardiovascular hypertension, Pt. on medications + Valvular Problems/Murmurs AS  Rhythm:Regular Rate:Normal     Neuro/Psych negative neurological ROS     GI/Hepatic negative GI ROS,,,(+) Cirrhosis         Endo/Other  negative endocrine ROS    Renal/GU negative Renal ROS     Musculoskeletal   Abdominal   Peds  Hematology negative hematology ROS (+)   Anesthesia Other Findings   Reproductive/Obstetrics                              Lab Results  Component Value Date   WBC 7.1 01/10/2022   HGB 15.5 01/10/2022   HCT 41.6 01/10/2022   MCV 90.2 01/10/2022   PLT 122 (L) 01/10/2022   Lab Results  Component Value Date   CREATININE 0.75 05/30/2020   BUN 6 05/30/2020   NA 137 05/30/2020   K 4.1 05/30/2020   CL 106 05/30/2020   CO2 23 05/30/2020    Anesthesia Physical Anesthesia Plan  ASA: 3  Anesthesia Plan: General   Post-op Pain Management: Tylenol PO (pre-op)* and Ketamine IV*   Induction: Intravenous  PONV Risk Score and Plan: 1 and Dexamethasone, Ondansetron and Treatment may vary due to age or medical condition  Airway Management Planned: Double Lumen EBT  Additional Equipment: Arterial line  Intra-op Plan:   Post-operative Plan: Extubation in OR and Possible Post-op intubation/ventilation  Informed Consent: I have reviewed the patients History and Physical, chart, labs and discussed the procedure including the risks, benefits and alternatives for the proposed anesthesia with the patient or authorized representative who has indicated  his/her understanding and acceptance.     Dental advisory given  Plan Discussed with: CRNA  Anesthesia Plan Comments:          Anesthesia Quick Evaluation

## 2022-01-10 NOTE — Anesthesia Procedure Notes (Signed)
Procedure Name: Intubation Date/Time: 01/10/2022 8:09 AM  Performed by: Heide Scales, CRNAPre-anesthesia Checklist: Patient identified, Emergency Drugs available, Suction available and Patient being monitored Patient Re-evaluated:Patient Re-evaluated prior to induction Oxygen Delivery Method: Circle system utilized Preoxygenation: Pre-oxygenation with 100% oxygen Induction Type: IV induction Ventilation: Mask ventilation without difficulty Laryngoscope Size: Mac and 4 Grade View: Grade I Endobronchial tube: Left, Double lumen EBT and EBT position confirmed by fiberoptic bronchoscope and 39 Fr Number of attempts: 1 Airway Equipment and Method: Stylet and Oral airway Placement Confirmation: ETT inserted through vocal cords under direct vision, positive ETCO2 and breath sounds checked- equal and bilateral Tube secured with: Tape Dental Injury: Teeth and Oropharynx as per pre-operative assessment

## 2022-01-10 NOTE — Anesthesia Procedure Notes (Signed)
Arterial Line Insertion Start/End11/16/2023 7:11 AM, 01/10/2022 7:13 AM Performed by: Heide Scales, CRNA, CRNA  Patient location: Pre-op. Preanesthetic checklist: patient identified, IV checked, site marked, risks and benefits discussed, surgical consent, monitors and equipment checked, pre-op evaluation, timeout performed and anesthesia consent Lidocaine 1% used for infiltration Right, radial was placed Catheter size: 20 G Hand hygiene performed , maximum sterile barriers used  and Seldinger technique used Allen's test indicative of satisfactory collateral circulation Attempts: 1 Procedure performed without using ultrasound guided technique. Following insertion, dressing applied. Post procedure assessment: normal and unchanged  Patient tolerated the procedure well with no immediate complications.

## 2022-01-10 NOTE — Interval H&P Note (Signed)
History and Physical Interval Note:  01/10/2022 7:39 AM  Ray Lewis  has presented today for surgery, with the diagnosis of THORACIC SPINE TUMOR.  The various methods of treatment have been discussed with the patient and family. After consideration of risks, benefits and other options for treatment, the patient has consented to  Procedure(s) with comments: XI ROBOTIC ASSISTED THORACOSCOPY, possible Thoracotomy with resection of posterior mediastinal mass (Left) - [name] TO START THORACOTOMY FOR RESECTION OF T7-8 SPINAL TUMOR (N/A) as a surgical intervention.  The patient's history has been reviewed, patient examined, no change in status, stable for surgery.  I have reviewed the patient's chart and labs.  Questions were answered to the patient's satisfaction.     [name]

## 2022-01-10 NOTE — Anesthesia Procedure Notes (Signed)
Procedure Name: Intubation Date/Time: 01/10/2022 11:08 AM  Performed by: Heide Scales, CRNAPre-anesthesia Checklist: Patient identified, Emergency Drugs available, Suction available and Patient being monitored Patient Re-evaluated:Patient Re-evaluated prior to induction Oxygen Delivery Method: Circle system utilized Preoxygenation: Pre-oxygenation with 100% oxygen Induction Type: IV induction Laryngoscope Size: Glidescope and 4 Tube type: Oral Tube size: 8.0 mm Number of attempts: 1 Airway Equipment and Method: Video-laryngoscopy Placement Confirmation: ETT inserted through vocal cords under direct vision, positive ETCO2 and breath sounds checked- equal and bilateral Secured at: 23 cm Tube secured with: Tape Dental Injury: Teeth and Oropharynx as per pre-operative assessment  Comments: DLT exchanged to single lumen endotracheal tube

## 2022-01-10 NOTE — Hospital Course (Addendum)
PCP is Angelina Sheriff, MD Referring Provider is Angelina Sheriff, MD   HPI: At time of surgical consultation by Dr. Roxan Hockey, the patient was also seen in neurosurgical consultation by Dr. Arnoldo Morale   Ray Lewis is sent for consultation regarding a posterior mediastinal mass.   Ray Lewis is a 51 year old man with a history of hypertension, hyperlipidemia, mild CAD, moderate aortic stenosis, atrial fibrillation, arthritis, ethanol abuse (quit 2 years ago), cirrhosis, esophageal varices, tobacco abuse, and a posterior mediastinal mass.  He was found to have a posterior mediastinal mass about 2 years ago.  This was felt likely to be benign.     He recently has been having some pain in his left posterior back.  He also has paresthesias radiating anteriorly from that area.  An MR and an outside institution showed the tumor had increased in size.  There is a small intraforaminal component.   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  No chest pain, pressure, tightness, or shortness of breath.  Dr. Roxan Hockey in [place] evaluated the patient and relevant studies and recommended proceeding with surgical resection.  He was admitted this hospitalization electively for the procedure.   Hospital course:  Patient was taken the operating room on 01/10/2022 at which time he underwent left robotic assisted thoracoscopic surgery for resection of spinal sheath tumor.  Subsequent to that he underwent posterior approach resection of small foraminal portion of the tumor by Dr. Arnoldo Morale.  He tolerated the procedure well was taken to the postanesthesia care unit in stable condition.  The patient did well post operatively.  His chest tube was kept on suction for 24 hours.  He had 300 cc output from chest tube.  This was removed on POD #1.  Follow up CXR showed no pleural effusion or pneumothorax.  He was evaluated by Dr. Arnoldo Morale and patient had the expected amount of thoracic numbness.   He has been resumed on his home regimen of antihypertensives. His thoracic drain was removed without difficulty.  He is ambulating without difficulty.  His surgical incisions are healing without difficulty.  Per patient request, prescription for pain is Percocet and sent to his pharmacy. Also, I discussed with neurosurgery NP when to restart Apixaban and patient instructed to restart Monday 01/14/2022. He is medically stable for discharge home today.

## 2022-01-10 NOTE — Brief Op Note (Addendum)
01/10/2022  10:58 AM  PATIENT:  Ray Lewis  51 y.o. male  PRE-OPERATIVE DIAGNOSIS:  THORACIC PARASPINAL TUMOR  POST-OPERATIVE DIAGNOSIS:  THORACIC PARASPINAL TUMOR  PROCEDURE:  Xi ROBOTIC ASSISTED LEFT THORACOSCOPY RESECTION OF PARASPINAL MASS (in conjunction with Dr. Arnoldo Morale) [place] - levels 3-10  SURGEON:  Surgeon(s) and Role: Panel 1:    * [name], MD - Primary Panel 2:    * Newman Pies, MD - Primary  PHYSICIAN ASSISTANT: WAYNE GOLD PA-C  ASSISTANTS: none   ANESTHESIA:   general  EBL:  400 mL   BLOOD ADMINISTERED:none  DRAINS: (28 F) Blake drain(s) in the LEFT HEMITHORAX    LOCAL MEDICATIONS USED:  BUPIVICAINE  and OTHER EXPAREL  SPECIMEN:  Source of Specimen:  TUMOR  DISPOSITION OF SPECIMEN:  PATHOLOGY  COUNTS:  YES  TOURNIQUET:  * No tourniquets in log *  DICTATION: .Other Dictation: Dictation Number PENDING  PLAN OF CARE: Admit to inpatient   PATIENT DISPOSITION:  PACU - hemodynamically stable.   Delay start of Pharmacological VTE agent (>24hrs) due to surgical blood loss or risk of bleeding: yes  COMPLICATIONS: NO KNOWN

## 2022-01-11 ENCOUNTER — Encounter (HOSPITAL_COMMUNITY): Payer: Self-pay | Admitting: Thoracic Surgery (Cardiothoracic Vascular Surgery)

## 2022-01-11 ENCOUNTER — Inpatient Hospital Stay (HOSPITAL_COMMUNITY): Payer: No Typology Code available for payment source

## 2022-01-11 DIAGNOSIS — Z9889 Other specified postprocedural states: Secondary | ICD-10-CM

## 2022-01-11 LAB — BASIC METABOLIC PANEL
Anion gap: 10 (ref 5–15)
BUN: 14 mg/dL (ref 6–20)
CO2: 22 mmol/L (ref 22–32)
Calcium: 8.3 mg/dL — ABNORMAL LOW (ref 8.9–10.3)
Chloride: 101 mmol/L (ref 98–111)
Creatinine, Ser: 0.95 mg/dL (ref 0.61–1.24)
GFR, Estimated: >60 mL/min (ref >60)
Glucose, Bld: 179 mg/dL — ABNORMAL HIGH (ref 70–99)
Potassium: 3.8 mmol/L (ref 3.5–5.1)
Sodium: 133 mmol/L — ABNORMAL LOW (ref 135–145)

## 2022-01-11 LAB — CBC
HCT: 33 % — ABNORMAL LOW (ref 39.0–52.0)
Hemoglobin: 12 g/dL — ABNORMAL LOW (ref 13.0–17.0)
MCH: 33.1 pg (ref 26.0–34.0)
MCHC: 36.4 g/dL — ABNORMAL HIGH (ref 30.0–36.0)
MCV: 90.9 fL (ref 80.0–100.0)
Platelets: 92 10*3/uL — ABNORMAL LOW (ref 150–400)
RBC: 3.63 MIL/uL — ABNORMAL LOW (ref 4.22–5.81)
RDW: 11.8 % (ref 11.5–15.5)
WBC: 10.5 10*3/uL (ref 4.0–10.5)
nRBC: 0 % (ref 0.0–0.2)

## 2022-01-11 MED FILL — Thrombin For Soln 5000 Unit: CUTANEOUS | Qty: 2 | Status: AC

## 2022-01-11 NOTE — Progress Notes (Addendum)
      [address]       [place] [address]             [phone]      1 Day Post-Op Procedure(s) (LRB): XI ROBOTIC ASSISTED THORACOSCOPY, RESECTION POSTERIOR MEDIASTINAL MASS (Left) THORACIC LAMINECTOMY T7-T8 (N/A) INTERCOSTAL NERVE BLOCK  Subjective:  Patient sitting up in bed without complaints.  His pain is well controlled.  Denies N/V.  Objective: Vital signs in last 24 hours: Temp:  [97.9 F (36.6 C)-98.6 F (37 C)] 97.9 F (36.6 C) (11/17 0300) Pulse Rate:  [61-78] 61 (11/17 0300) Cardiac Rhythm: Normal sinus rhythm (11/16 1950) Resp:  [10-21] 21 (11/17 0300) BP: (115-140)/(50-76) 117/50 (11/17 0300) SpO2:  [96 %-98 %] 98 % (11/17 0300) Arterial Line BP: (125-159)/(69-77) 156/69 (11/16 1445)  Intake/Output from previous day: 11/16 0701 - 11/17 0700 In: 3240 [P.O.:240; I.V.:2400; IV Piggyback:600] Out: 3168 [Urine:2125; Drains:45; Blood:800; Chest Tube:198] Intake/Output this shift: Total I/O In: -  Out: 30 [Chest Tube:30]  General appearance: alert, cooperative, and no distress Heart: regular rate and rhythm Lungs: clear to auscultation bilaterally Abdomen: soft, non-tender; bowel sounds normal; no masses,  no organomegaly Extremities: extremities normal, atraumatic, no cyanosis or edema Wound: clean and dry  Lab Results: Recent Labs    01/10/22 0609 01/10/22 0850 01/11/22 0007  WBC 7.1  --  10.5  HGB 15.5 13.3 12.0*  HCT 41.6 39.0 33.0*  PLT 122*  --  92*   BMET:  Recent Labs    01/10/22 0610 01/10/22 0850 01/11/22 0007  NA 134* 135 133*  K 4.0 3.8 3.8  CL 102  --  101  CO2 19*  --  22  GLUCOSE 124*  --  179*  BUN 11  --  14  CREATININE 0.86  --  0.95  CALCIUM 9.0  --  8.3*    PT/INR:  Recent Labs    01/10/22 0609  LABPROT 15.4*  INR 1.2   ABG    Component Value Date/Time   PHART 7.258 (L) 01/10/2022 0850   HCO3 23.3 01/10/2022 0850   TCO2 25 01/10/2022 0850   ACIDBASEDEF 4.0 (H) 01/10/2022 0850   O2SAT  95 01/10/2022 0850   CBG (last 3)  No results for input(s): "GLUCAP" in the last 72 hours.  Assessment/Plan: S/P Procedure(s) (LRB): XI ROBOTIC ASSISTED THORACOSCOPY, RESECTION POSTERIOR MEDIASTINAL MASS (Left) THORACIC LAMINECTOMY T7-T8 (N/A) INTERCOSTAL NERVE BLOCK  CV- NSR, H/O HTN- continue Zestril, Betapace Pulm- CT on suction w/o air leak, 300 cc output since surgery,CXR w/o pneumothorax will place on water seal Renal- creatinine, lytes are normal. Okay to continue Toradol,  S/P Laminectomy- drains per Neurosurgery Thrombocytopenia- chronic, came in at 53, down to 47 will d/c Lovenox Dispo- patient stable, overall doing well, CT to water seal, lumber drains per Neurosurgery   LOS: 1 day    Ellwood Handler, PA-C 01/11/2022  Patient seen and examined, agree with above Doing well from a pain perspective Dc chest tube Probably home tomorrow Ambulate  Remo Lipps C. Roxan Hockey, MD [facility] [phone]

## 2022-01-11 NOTE — TOC Progression Note (Signed)
Transition of Care [facility]) - Progression Note    Patient Details  Name: Ray Lewis MRN: [id number] Date of Birth: 09/21/1970  [facility] CM/SW Contact  Zenon Mayo, RN Phone Number: 01/11/2022, 1:01 PM  Clinical Narrative:    From home, chest tube out, plan to dc home tomorrow, no needs. TOC following.        Expected Discharge Plan and Services                                                 Social Determinants of Health (SDOH) Interventions    Readmission Risk Interventions     No data to display

## 2022-01-11 NOTE — Progress Notes (Signed)
Subjective: The patient is alert and pleasant.  He looks and feels well.  He has no complaints.  Objective: Vital signs in last 24 hours: Temp:  [97.9 F (36.6 C)-98.6 F (37 C)] 97.9 F (36.6 C) (11/17 0300) Pulse Rate:  [61-78] 61 (11/17 0300) Resp:  [10-21] 21 (11/17 0300) BP: (115-140)/(50-76) 117/50 (11/17 0300) SpO2:  [96 %-98 %] 98 % (11/17 0300) Arterial Line BP: (125-159)/(69-77) 156/69 (11/16 1445) Estimated body mass index is 31.9 kg/m as calculated from the following:   Height as of this encounter: '5\' 9"'$  (1.753 m).   Weight as of this encounter: 98 kg.   Intake/Output from previous day: 11/16 0701 - 11/17 0700 In: 3240 [P.O.:240; I.V.:2400; IV Piggyback:600] Out: 3168 [Urine:2125; Drains:45; Blood:800; Chest Tube:198] Intake/Output this shift: Total I/O In: -  Out: 30 [Chest Tube:30]  Physical exam the patient is alert and pleasant.  His strength is normal.  He has some thoracic numbness as expected.  Lab Results: Recent Labs    01/10/22 0609 01/10/22 0850 01/11/22 0007  WBC 7.1  --  10.5  HGB 15.5 13.3 12.0*  HCT 41.6 39.0 33.0*  PLT 122*  --  92*   BMET Recent Labs    01/10/22 0610 01/10/22 0850 01/11/22 0007  NA 134* 135 133*  K 4.0 3.8 3.8  CL 102  --  101  CO2 19*  --  22  GLUCOSE 124*  --  179*  BUN 11  --  14  CREATININE 0.86  --  0.95  CALCIUM 9.0  --  8.3*    Studies/Results: DG Chest Port 1 View  Result Date: 01/10/2022 CLINICAL DATA:  Chest tube placement. EXAM: PORTABLE CHEST 1 VIEW COMPARISON:  Preoperative chest x-ray, same date. FINDINGS: The cardiac silhouette, mediastinal and hilar contours are normal. Left-sided chest tube is noted. No pneumothorax. No infiltrates or effusions. IMPRESSION: Left-sided chest tube in place. No pneumothorax. Electronically Signed   By: Marijo Sanes M.D.   On: 01/10/2022 14:50   DG Thoracic Spine 2 View  Result Date: 01/10/2022 CLINICAL DATA:  Elective surgery.  Thoracic tumor resection.  EXAM: THORACIC SPINE 2 VIEWS COMPARISON:  Thoracic spine radiographs earlier today FINDINGS: Two intraoperative radiographs are provided. On the first image, tissue retractors are in place and a surgical instrument projects over the T8 level. On the second image, a surgical instrument projects over the inferior T7 level. IMPRESSION: Intraoperative images during thoracic surgery. Electronically Signed   By: Logan Bores M.D.   On: 01/10/2022 14:17   DG THORACOLUMABAR SPINE  Result Date: 01/10/2022 CLINICAL DATA:  Paraspinal mass resection EXAM: THORACOLUMBAR SPINE 1V COMPARISON:  CT 12/15/2019 FINDINGS: Images obtained intraoperatively for localization. Image 1: Surgical instrument projects at the level of the T9 inferior endplate. Image 2: Surgical instrument projects at the level of the T8 inferior endplate. Image 3: Surgical instrument projects at the level of the T8 inferior endplate. IMPRESSION: Intraoperative localization for thoracic surgery, as above. Electronically Signed   By: Davina Poke D.O.   On: 01/10/2022 12:48   DG Chest 2 View  Result Date: 01/10/2022 CLINICAL DATA:  Preop for thoracic spine tumor EXAM: CHEST - 2 VIEW COMPARISON:  03/01/2019 FINDINGS: Known left paraspinal mass seen on the frontal radiograph. There is no edema, consolidation, effusion, or pneumothorax. Normal heart size and mediastinal contours. IMPRESSION: 1. No evidence of active disease. 2. Known left paraspinal mass. Electronically Signed   By: Jorje Guild M.D.   On: 01/10/2022 06:17  Assessment/Plan: Postop day 1: The patient is doing well.  We can get him home when his chest tube is out.  LOS: 1 day     Ophelia Charter 01/11/2022, 7:50 AM     Patient ID: Ray Lewis, male   DOB: 10-17-1970, 51 y.o.   MRN: [id number]

## 2022-01-11 NOTE — Progress Notes (Signed)
Mobility Specialist Progress Note    01/11/22 1005  Mobility  Activity Ambulated with assistance in hallway  Level of Assistance Standby assist, set-up cues, supervision of patient - no hands on  Assistive Device Front wheel walker  Distance Ambulated (ft) 420 ft  Activity Response Tolerated well  Mobility Referral Yes  $Mobility charge 1 Mobility   Pre-Mobility: 73 HR, 98% SpO2 During Mobility: 88 HR, 97% SpO2 Post-Mobility: 72 HR, 99% SpO2  Pt received sitting EOB and agreeable. No complaints on walk. Returned to sitting EOB with call bell in reach.    Hildred Alamin Mobility Specialist  Please Psychologist, sport and exercise or Rehab Office at [phone]

## 2022-01-12 ENCOUNTER — Inpatient Hospital Stay (HOSPITAL_COMMUNITY): Payer: No Typology Code available for payment source

## 2022-01-12 LAB — TYPE AND SCREEN
ABO/RH(D): O POS
Antibody Screen: NEGATIVE
Unit division: 0
Unit division: 0

## 2022-01-12 LAB — COMPREHENSIVE METABOLIC PANEL
ALT: 22 U/L (ref 0–44)
AST: 32 U/L (ref 15–41)
Albumin: 3.2 g/dL — ABNORMAL LOW (ref 3.5–5.0)
Alkaline Phosphatase: 59 U/L (ref 38–126)
Anion gap: 7 (ref 5–15)
BUN: 17 mg/dL (ref 6–20)
CO2: 23 mmol/L (ref 22–32)
Calcium: 8.3 mg/dL — ABNORMAL LOW (ref 8.9–10.3)
Chloride: 105 mmol/L (ref 98–111)
Creatinine, Ser: 0.91 mg/dL (ref 0.61–1.24)
GFR, Estimated: >60 mL/min (ref >60)
Glucose, Bld: 105 mg/dL — ABNORMAL HIGH (ref 70–99)
Potassium: 3.8 mmol/L (ref 3.5–5.1)
Sodium: 135 mmol/L (ref 135–145)
Total Bilirubin: 0.7 mg/dL (ref 0.3–1.2)
Total Protein: 6 g/dL — ABNORMAL LOW (ref 6.5–8.1)

## 2022-01-12 LAB — BPAM RBC
Blood Product Expiration Date: 202312112359
Blood Product Expiration Date: 202312112359
Unit Type and Rh: 5100
Unit Type and Rh: 5100

## 2022-01-12 LAB — CBC
HCT: 31 % — ABNORMAL LOW (ref 39.0–52.0)
Hemoglobin: 11 g/dL — ABNORMAL LOW (ref 13.0–17.0)
MCH: 32.8 pg (ref 26.0–34.0)
MCHC: 35.5 g/dL (ref 30.0–36.0)
MCV: 92.5 fL (ref 80.0–100.0)
Platelets: 87 10*3/uL — ABNORMAL LOW (ref 150–400)
RBC: 3.35 MIL/uL — ABNORMAL LOW (ref 4.22–5.81)
RDW: 11.9 % (ref 11.5–15.5)
WBC: 10.4 10*3/uL (ref 4.0–10.5)
nRBC: 0 % (ref 0.0–0.2)

## 2022-01-12 MED ORDER — OXYCODONE-ACETAMINOPHEN 5-325 MG PO TABS: 1.0000 | ORAL_TABLET | Freq: Four times a day (QID) | ORAL | 0 refills | Status: DC | PRN

## 2022-01-12 MED ORDER — ELIQUIS 5 MG PO TABS: 5.0000 mg | ORAL_TABLET | Freq: Two times a day (BID) | ORAL | 0 refills | Status: AC

## 2022-01-12 NOTE — Progress Notes (Signed)
Postop day 2.  Patient doing very well.  Back pain very well controlled.  Chest wall pain not limiting.  No new motor or sensory complaints.  Patient standing ambulating and voiding without difficulty.  Feels ready for discharge home.  Awake and alert.  Oriented and appropriate.  Motor and sensory function intact.  Wound dressing clean and dry.  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  Okay for discharge home from our standpoint.  Follow-up with Dr. Arnoldo Morale in 2 weeks.

## 2022-01-12 NOTE — Plan of Care (Signed)
  Problem: Activity: Goal: Risk for activity intolerance will decrease Outcome: Progressing   Problem: Respiratory: Goal: Respiratory status will improve Outcome: Progressing   Problem: Skin Integrity: Goal: Wound healing without signs and symptoms infection will improve Outcome: Progressing   Problem: Clinical Measurements: Goal: Respiratory complications will improve Outcome: Progressing Goal: Cardiovascular complication will be avoided Outcome: Progressing   Problem: Activity: Goal: Risk for activity intolerance will decrease Outcome: Progressing   Problem: Nutrition: Goal: Adequate nutrition will be maintained Outcome: Progressing   Problem: Pain Management: Goal: Pain level will decrease Outcome: Not Progressing

## 2022-01-12 NOTE — Progress Notes (Addendum)
      [address]       ,[place] [address]             [phone]       2 Days Post-Op Procedure(s) (LRB): XI ROBOTIC ASSISTED THORACOSCOPY, RESECTION POSTERIOR MEDIASTINAL MASS (Left) THORACIC LAMINECTOMY T7-T8 (N/A) INTERCOSTAL NERVE BLOCK  Subjective: Patient sitting in chair. He has no complaints. He wants to go home.  Objective: Vital signs in last 24 hours: Temp:  [98 F (36.7 C)-99 F (37.2 C)] 98 F (36.7 C) (11/18 0801) Pulse Rate:  [65-77] 65 (11/18 0801) Cardiac Rhythm: Normal sinus rhythm (11/18 0702) Resp:  [15-19] 19 (11/18 0801) BP: (108-123)/(50-60) 108/57 (11/18 0801) SpO2:  [96 %-98 %] 97 % (11/18 0801)     Intake/Output from previous day: 11/17 0701 - 11/18 0700 In: 480 [P.O.:480] Out: 30 [Chest Tube:30]   Physical Exam:  Cardiovascular: RRR Pulmonary: Clear to auscultation bilaterally Abdomen: Soft, non tender, bowel sounds present. Extremities: No lower extremity edema. Wounds: Clean and dry.  No erythema or signs of infection.   Lab Results: CBC: Recent Labs    01/11/22 0007 01/12/22 0010  WBC 10.5 10.4  HGB 12.0* 11.0*  HCT 33.0* 31.0*  PLT 92* 87*   BMET:  Recent Labs    01/11/22 0007 01/12/22 0010  NA 133* 135  K 3.8 3.8  CL 101 105  CO2 22 23  GLUCOSE 179* 105*  BUN 14 17  CREATININE 0.95 0.91  CALCIUM 8.3* 8.3*    PT/INR:  Recent Labs    01/10/22 0609  LABPROT 15.4*  INR 1.2   ABG:  INR: Will add last result for INR, ABG once components are confirmed Will add last 4 CBG results once components are confirmed  Assessment/Plan:  1. CV - SR. On Lisinopril 5 mg daily and Sotalol 80 mg bid as taken prior to surgery 2.  Pulmonary - On room air. History of OSA (CPAP). CXR this am shows no pleural effusion or pneumothorax. Encourage incentive spirometer. 3. Supplement potassium 4. Expected post op blood loss anemia-H and H this am slightly decreased to 11 and 31 5. Thrombocytopenia-platelets  87,000. Platelet count 11/16 was 122,000. No signs of bleeding 6. Discharge  Larenzo Caples M ZimmermanPA-C 01/12/2022,9:19 AM

## 2022-01-16 LAB — SURGICAL PATHOLOGY

## 2022-01-29 ENCOUNTER — Other Ambulatory Visit: Payer: Self-pay | Admitting: Thoracic Surgery (Cardiothoracic Vascular Surgery)

## 2022-01-29 ENCOUNTER — Encounter: Payer: Self-pay | Admitting: Thoracic Surgery (Cardiothoracic Vascular Surgery)

## 2022-01-29 ENCOUNTER — Ambulatory Visit
Admission: RE | Admit: 2022-01-29 | Discharge: 2022-01-29 | Disposition: A | Discharge status: Home / Self Care | Payer: No Typology Code available for payment source | Source: Ambulatory Visit | Attending: Thoracic Surgery (Cardiothoracic Vascular Surgery) | Admitting: Thoracic Surgery (Cardiothoracic Vascular Surgery)

## 2022-01-29 ENCOUNTER — Ambulatory Visit (INDEPENDENT_AMBULATORY_CARE_PROVIDER_SITE_OTHER): Payer: Self-pay | Admitting: Thoracic Surgery (Cardiothoracic Vascular Surgery)

## 2022-01-29 ENCOUNTER — Ambulatory Visit: Payer: No Typology Code available for payment source | Admitting: Thoracic Surgery (Cardiothoracic Vascular Surgery)

## 2022-01-29 ENCOUNTER — Ambulatory Visit: Payer: No Typology Code available for payment source

## 2022-01-29 VITALS — BP 136/71 | HR 64 | Resp 20 | Ht 69.0 in | Wt 213.0 lb

## 2022-01-29 DIAGNOSIS — R222 Localized swelling, mass and lump, trunk: Secondary | ICD-10-CM

## 2022-01-29 DIAGNOSIS — R911 Solitary pulmonary nodule: Secondary | ICD-10-CM

## 2022-01-29 DIAGNOSIS — Z09 Encounter for follow-up examination after completed treatment for conditions other than malignant neoplasm: Secondary | ICD-10-CM

## 2022-01-29 DIAGNOSIS — D492 Neoplasm of unspecified behavior of bone, soft tissue, and skin: Secondary | ICD-10-CM

## 2022-01-29 DIAGNOSIS — Z0389 Encounter for observation for other suspected diseases and conditions ruled out: Secondary | ICD-10-CM | POA: Diagnosis not present

## 2022-01-29 MED ORDER — OXYCODONE-ACETAMINOPHEN 5-325 MG PO TABS: 1.0000 | ORAL_TABLET | Freq: Four times a day (QID) | ORAL | 0 refills | Status: AC | PRN

## 2022-01-29 MED ORDER — GABAPENTIN 300 MG PO CAPS: 300.0000 mg | ORAL_CAPSULE | Freq: Two times a day (BID) | ORAL | 3 refills | Status: AC

## 2022-01-29 NOTE — Progress Notes (Signed)
[address]       [place] [address]             [phone]      HPI: Ray Lewis returns for a scheduled follow-up visit after recent resection of a paravertebral mass.  Ray Lewis is a 51 year old man with a history of hypertension, hyperlipidemia, CAD, moderate aortic stenosis, atrial fibrillation, arthritis, ethanol abuse, cirrhosis, esophageal varices, tobacco abuse, and a posterior mediastinal/paravertebral mass.  He was found to have a paravertebral mass about 2 years ago.  Over time that increased in size and he was having some pain in that region.  Dr. Arnoldo Morale and I did a combined procedure on 01/10/2022 I did a robotic resection of the intrathoracic component and he did a T7-8 laminectomy to remove the intraforaminal component.  Pathology showed a schwannoma.  He did well postoperatively and went home on day 2.  Since discharge she has been doing well.  He does have some pain particularly at night.  He is not having to take much pain medication during the day but has had some trouble sleeping at night.  He has a CAD dull aching muscular pain in his back and then a tingling pins-and-needles type pain along the left flank.  Past Medical History:  Diagnosis Date   Arthritis    Cirrhosis ([place])    Hyperlipidemia    Hypertension    Murmur    Sleep apnea    Wears Cpap    Current Outpatient Medications  Medication Sig Dispense Refill   aspirin EC 81 MG tablet Take 81 mg by mouth daily. Swallow whole.     ELIQUIS 5 MG TABS tablet Take 1 tablet (5 mg total) by mouth 2 (two) times daily. 60 tablet 0   lisinopril (ZESTRIL) 5 MG tablet Take 5 mg by mouth daily.     magnesium oxide (MAG-OX) 400 (240 Mg) MG tablet Take 400 mg by mouth daily.     Multiple Vitamin (MULTIVITAMIN WITH MINERALS) TABS tablet Take 1 tablet by mouth daily.     nitroGLYCERIN (NITROSTAT) 0.4 MG SL tablet Place 0.4 mg under the tongue every 5 (five) minutes as needed for chest pain.      SOTALOL AF 80 MG TABS Take 80 mg by mouth 2 (two) times daily.     Vitamin D-Vitamin K (VITAMIN K2-VITAMIN D3 PO) Take 100 mg by mouth daily.     gabapentin (NEURONTIN) 300 MG capsule Take 1 capsule (300 mg total) by mouth 2 (two) times daily. 30 capsule 3   oxyCODONE-acetaminophen (PERCOCET) 5-325 MG tablet Take 1 tablet by mouth every 6 (six) hours as needed for severe pain. 28 tablet 0   No current facility-administered medications for this visit.    Physical Exam BP 136/71   Pulse 64   Resp 20   Ht '5\' 9"'$  (1.753 m)   Wt 213 lb (96.6 kg)   SpO2 97% Comment: RA  BMI 31.74 kg/m  51 year old man in no acute distress Alert and oriented x3 with no focal deficits Lungs clear with equal breath sounds bilaterally Incisions clean dry and intact  Diagnostic Tests: CHEST - 2 VIEW   COMPARISON:  01/12/2022   FINDINGS: Lungs are adequately inflated without focal airspace consolidation or effusion. Cardiomediastinal silhouette and remainder of the exam is unchanged.   IMPRESSION: No active cardiopulmonary disease.     Electronically Signed   By: Marin Olp M.D.   On: 01/29/2022 12:15  I personally reviewed the chest x-ray images.  Normal appearance.  No effusions or infiltrates.  Impression: Ray Lewis is a 51 year old man with a history of hypertension, hyperlipidemia, CAD, moderate aortic stenosis, atrial fibrillation, arthritis, ethanol abuse, cirrhosis, esophageal varices, tobacco abuse, and a posterior mediastinal/paravertebral mass.  Posterior mediastinal/paravertebral mass-status post resection with a combined robotic intrathoracic resection and laminectomy.  Done in conjunction with Dr. Arnoldo Morale.  Pathology showed a benign schwannoma.  Neurosurgical standpoint he is doing well.  He does have some pain which is to be expected.  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  I recommended we increase  his gabapentin to 300 mg p.o. twice daily.  I did refill his Percocet 5/325, 1 tablet p.o. every 6 hours as needed, 28 tablets, no refills.  From my standpoint he may drive as long as he is not having to take narcotics during the day.  He should limit himself to short trips around town for now.  No other restrictions on his activities from my standpoint.  Did advise him to get clearance from Dr. Arnoldo Morale before increasing his activities.  He has an appointment with him on Friday.  Plan: Refilled Percocet prescription Follow-up with Dr. Arnoldo Morale later this week Return in 6 weeks to check on progress  [name], MD [facility] [phone]

## 2022-02-06 DIAGNOSIS — K7460 Unspecified cirrhosis of liver: Secondary | ICD-10-CM | POA: Diagnosis not present

## 2022-02-06 DIAGNOSIS — K766 Portal hypertension: Secondary | ICD-10-CM | POA: Diagnosis not present

## 2022-02-06 DIAGNOSIS — K7030 Alcoholic cirrhosis of liver without ascites: Secondary | ICD-10-CM | POA: Diagnosis not present

## 2022-02-11 DIAGNOSIS — K769 Liver disease, unspecified: Secondary | ICD-10-CM | POA: Diagnosis not present

## 2022-02-19 ENCOUNTER — Encounter: Payer: Self-pay | Admitting: Thoracic Surgery (Cardiothoracic Vascular Surgery)

## 2022-03-12 ENCOUNTER — Ambulatory Visit (INDEPENDENT_AMBULATORY_CARE_PROVIDER_SITE_OTHER): Payer: Self-pay | Admitting: Thoracic Surgery (Cardiothoracic Vascular Surgery)

## 2022-03-12 ENCOUNTER — Encounter: Payer: Self-pay | Admitting: Thoracic Surgery (Cardiothoracic Vascular Surgery)

## 2022-03-12 VITALS — BP 132/74 | HR 70 | Resp 20 | Ht 69.0 in | Wt 196.0 lb

## 2022-03-12 DIAGNOSIS — R222 Localized swelling, mass and lump, trunk: Secondary | ICD-10-CM

## 2022-03-12 DIAGNOSIS — D492 Neoplasm of unspecified behavior of bone, soft tissue, and skin: Secondary | ICD-10-CM

## 2022-03-12 DIAGNOSIS — R911 Solitary pulmonary nodule: Secondary | ICD-10-CM

## 2022-03-12 DIAGNOSIS — Z09 Encounter for follow-up examination after completed treatment for conditions other than malignant neoplasm: Secondary | ICD-10-CM

## 2022-03-12 NOTE — Progress Notes (Signed)
[address]       [place] [address]             [phone]     HPI: Mr. Biggins returns for a scheduled postoperative follow-up visit  Armstrong Creasy is a 52 year old man with a history of hypertension, hyperlipidemia, CAD, moderate aortic stenosis, atrial fibrillation, arthritis, ethanol abuse, cirrhosis, esophageal varices, tobacco abuse, and a thoracic schwannoma.  He underwent a combined procedure to remove the schwannoma on 01/10/2022.  I did a robotic resection of the intrathoracic component and Dr. Arnoldo Morale performed a T7-8 laminectomy to remove the intraforaminal component.  He did well postoperatively and went home on day 2.  He has not been having much pain.  He does have a lump in his left flank and has numbness in that area.  He had gone into atrial fibrillation over the weekend and felt poorly with that.  He is now back in a normal rhythm.  Past Medical History:  Diagnosis Date   Arthritis    Cirrhosis ([place])    Hyperlipidemia    Hypertension    Murmur    Sleep apnea    Wears Cpap    Current Outpatient Medications  Medication Sig Dispense Refill   aspirin EC 81 MG tablet Take 81 mg by mouth daily. Swallow whole.     ELIQUIS 5 MG TABS tablet Take 1 tablet (5 mg total) by mouth 2 (two) times daily. 60 tablet 0   gabapentin (NEURONTIN) 300 MG capsule Take 1 capsule (300 mg total) by mouth 2 (two) times daily. 30 capsule 3   lisinopril (ZESTRIL) 5 MG tablet Take 5 mg by mouth daily.     magnesium oxide (MAG-OX) 400 (240 Mg) MG tablet Take 400 mg by mouth daily.     Multiple Vitamin (MULTIVITAMIN WITH MINERALS) TABS tablet Take 1 tablet by mouth daily.     nitroGLYCERIN (NITROSTAT) 0.4 MG SL tablet Place 0.4 mg under the tongue every 5 (five) minutes as needed for chest pain.     oxyCODONE-acetaminophen (PERCOCET) 5-325 MG tablet Take 1 tablet by mouth every 6 (six) hours as needed for severe pain. 28 tablet 0   SOTALOL AF 80 MG TABS Take 80 mg by  mouth 2 (two) times daily.     Vitamin D-Vitamin K (VITAMIN K2-VITAMIN D3 PO) Take 100 mg by mouth daily.     No current facility-administered medications for this visit.    Physical Exam BP 132/74 (BP Location: Left Arm, Patient Position: Sitting, Cuff Size: Normal)   Pulse 70   Resp 20   Ht '5\' 9"'$  (1.753 m)   Wt 196 lb (88.9 kg)   SpO2 97% Comment: RA  BMI 28.3 kg/m  52 year old man in no acute distress Alert and oriented x 3 with no focal deficit Lungs clear bilaterally Cardiac regular rate and rhythm Incisions well-healed Muscular laxity left flank  Diagnostic Tests: None  Impression: Ray Lewis is a 52 year old man with a history of hypertension, hyperlipidemia, CAD, moderate aortic stenosis, atrial fibrillation, arthritis, ethanol abuse, cirrhosis, esophageal varices, tobacco abuse, and a posterior mediastinal schwannoma.  Underwent combined procedure for resection of the 5 cm posterior mediastinal mass with robotic resection of intrathoracic component and posterior approach for a laminectomy by Dr. Arnoldo Morale at the same setting.  Pathology was consistent with benign schwannoma.  He is doing well from a surgical standpoint.  He does have some muscular laxity and numbness in the area supplied by the  nerve root.  I reassured him that that is to be expected.  May or may not improve with time as his nerve root was resected with the procedure given the size of the mass.  No restrictions on his activities from my standpoint.  He has a follow-up appoint with Dr. Arnoldo Morale in March.  Plan: Follow-up with Dr. Arnoldo Morale as scheduled I will be happy to see Mr. Henningsen back anytime in the future if I can be of any further assistance with his care  [name], MD [facility] [phone]

## 2022-04-30 DIAGNOSIS — D492 Neoplasm of unspecified behavior of bone, soft tissue, and skin: Secondary | ICD-10-CM | POA: Diagnosis not present

## 2022-05-21 DIAGNOSIS — I2510 Atherosclerotic heart disease of native coronary artery without angina pectoris: Secondary | ICD-10-CM | POA: Diagnosis not present

## 2022-05-21 DIAGNOSIS — M1990 Unspecified osteoarthritis, unspecified site: Secondary | ICD-10-CM | POA: Diagnosis not present

## 2022-05-21 DIAGNOSIS — I482 Chronic atrial fibrillation, unspecified: Secondary | ICD-10-CM | POA: Diagnosis not present

## 2022-05-21 DIAGNOSIS — Z6830 Body mass index (BMI) 30.0-30.9, adult: Secondary | ICD-10-CM | POA: Diagnosis not present

## 2022-06-17 DIAGNOSIS — Z0189 Encounter for other specified special examinations: Secondary | ICD-10-CM | POA: Diagnosis not present

## 2022-09-04 DIAGNOSIS — F17290 Nicotine dependence, other tobacco product, uncomplicated: Secondary | ICD-10-CM | POA: Diagnosis not present

## 2022-09-04 DIAGNOSIS — I10 Essential (primary) hypertension: Secondary | ICD-10-CM | POA: Diagnosis not present

## 2022-09-04 DIAGNOSIS — Z79899 Other long term (current) drug therapy: Secondary | ICD-10-CM | POA: Diagnosis not present

## 2022-09-04 DIAGNOSIS — I4891 Unspecified atrial fibrillation: Secondary | ICD-10-CM | POA: Diagnosis not present

## 2022-09-04 DIAGNOSIS — Z7901 Long term (current) use of anticoagulants: Secondary | ICD-10-CM | POA: Diagnosis not present

## 2022-09-04 DIAGNOSIS — K7030 Alcoholic cirrhosis of liver without ascites: Secondary | ICD-10-CM | POA: Diagnosis not present

## 2022-09-04 DIAGNOSIS — Z9049 Acquired absence of other specified parts of digestive tract: Secondary | ICD-10-CM | POA: Diagnosis not present

## 2022-09-11 DIAGNOSIS — H527 Unspecified disorder of refraction: Secondary | ICD-10-CM | POA: Diagnosis not present

## 2022-09-11 DIAGNOSIS — H2513 Age-related nuclear cataract, bilateral: Secondary | ICD-10-CM | POA: Diagnosis not present

## 2022-09-18 DIAGNOSIS — Z86010 Personal history of colonic polyps: Secondary | ICD-10-CM | POA: Diagnosis not present

## 2022-09-18 DIAGNOSIS — K7460 Unspecified cirrhosis of liver: Secondary | ICD-10-CM | POA: Diagnosis not present

## 2022-09-24 DIAGNOSIS — H527 Unspecified disorder of refraction: Secondary | ICD-10-CM | POA: Diagnosis not present

## 2022-10-15 DIAGNOSIS — S83511S Sprain of anterior cruciate ligament of right knee, sequela: Secondary | ICD-10-CM | POA: Diagnosis not present

## 2022-10-15 DIAGNOSIS — M1731 Unilateral post-traumatic osteoarthritis, right knee: Secondary | ICD-10-CM | POA: Diagnosis not present

## 2022-11-25 DIAGNOSIS — Z5181 Encounter for therapeutic drug level monitoring: Secondary | ICD-10-CM | POA: Diagnosis not present

## 2022-11-25 DIAGNOSIS — I4891 Unspecified atrial fibrillation: Secondary | ICD-10-CM | POA: Diagnosis not present

## 2022-11-25 DIAGNOSIS — I4892 Unspecified atrial flutter: Secondary | ICD-10-CM | POA: Diagnosis not present

## 2022-11-25 DIAGNOSIS — Z7901 Long term (current) use of anticoagulants: Secondary | ICD-10-CM | POA: Diagnosis not present

## 2022-11-28 DIAGNOSIS — Z7901 Long term (current) use of anticoagulants: Secondary | ICD-10-CM | POA: Diagnosis not present

## 2022-11-28 DIAGNOSIS — I4891 Unspecified atrial fibrillation: Secondary | ICD-10-CM | POA: Diagnosis not present

## 2022-12-16 DIAGNOSIS — K7460 Unspecified cirrhosis of liver: Secondary | ICD-10-CM | POA: Diagnosis not present

## 2022-12-16 DIAGNOSIS — E119 Type 2 diabetes mellitus without complications: Secondary | ICD-10-CM | POA: Diagnosis not present

## 2022-12-16 DIAGNOSIS — Z0189 Encounter for other specified special examinations: Secondary | ICD-10-CM | POA: Diagnosis not present

## 2022-12-16 DIAGNOSIS — F1021 Alcohol dependence, in remission: Secondary | ICD-10-CM | POA: Diagnosis not present

## 2022-12-16 DIAGNOSIS — I252 Old myocardial infarction: Secondary | ICD-10-CM | POA: Diagnosis not present

## 2022-12-27 DIAGNOSIS — E785 Hyperlipidemia, unspecified: Secondary | ICD-10-CM | POA: Diagnosis not present

## 2022-12-27 DIAGNOSIS — I350 Nonrheumatic aortic (valve) stenosis: Secondary | ICD-10-CM | POA: Diagnosis not present

## 2022-12-27 DIAGNOSIS — R9431 Abnormal electrocardiogram [ECG] [EKG]: Secondary | ICD-10-CM | POA: Diagnosis not present

## 2022-12-27 DIAGNOSIS — R7303 Prediabetes: Secondary | ICD-10-CM | POA: Diagnosis not present

## 2022-12-27 DIAGNOSIS — I491 Atrial premature depolarization: Secondary | ICD-10-CM | POA: Diagnosis not present

## 2022-12-27 DIAGNOSIS — R001 Bradycardia, unspecified: Secondary | ICD-10-CM | POA: Diagnosis not present

## 2022-12-27 DIAGNOSIS — I2119 ST elevation (STEMI) myocardial infarction involving other coronary artery of inferior wall: Secondary | ICD-10-CM | POA: Diagnosis not present

## 2022-12-27 DIAGNOSIS — I480 Paroxysmal atrial fibrillation: Secondary | ICD-10-CM | POA: Diagnosis not present

## 2023-01-08 DIAGNOSIS — F17290 Nicotine dependence, other tobacco product, uncomplicated: Secondary | ICD-10-CM | POA: Diagnosis not present

## 2023-02-10 DIAGNOSIS — K2289 Other specified disease of esophagus: Secondary | ICD-10-CM | POA: Diagnosis not present

## 2023-02-10 DIAGNOSIS — Z1211 Encounter for screening for malignant neoplasm of colon: Secondary | ICD-10-CM | POA: Diagnosis not present

## 2023-02-10 DIAGNOSIS — Z860101 Personal history of adenomatous and serrated colon polyps: Secondary | ICD-10-CM | POA: Diagnosis not present

## 2023-02-10 DIAGNOSIS — K635 Polyp of colon: Secondary | ICD-10-CM | POA: Diagnosis not present

## 2023-02-10 DIAGNOSIS — K5730 Diverticulosis of large intestine without perforation or abscess without bleeding: Secondary | ICD-10-CM | POA: Diagnosis not present

## 2023-02-27 DIAGNOSIS — R001 Bradycardia, unspecified: Secondary | ICD-10-CM | POA: Diagnosis not present

## 2023-02-27 DIAGNOSIS — I517 Cardiomegaly: Secondary | ICD-10-CM | POA: Diagnosis not present

## 2023-02-27 DIAGNOSIS — Z7901 Long term (current) use of anticoagulants: Secondary | ICD-10-CM | POA: Diagnosis not present

## 2023-02-27 DIAGNOSIS — I491 Atrial premature depolarization: Secondary | ICD-10-CM | POA: Diagnosis not present

## 2023-02-27 DIAGNOSIS — I4819 Other persistent atrial fibrillation: Secondary | ICD-10-CM | POA: Diagnosis not present

## 2023-02-27 DIAGNOSIS — I493 Ventricular premature depolarization: Secondary | ICD-10-CM | POA: Diagnosis not present

## 2023-02-28 DIAGNOSIS — R918 Other nonspecific abnormal finding of lung field: Secondary | ICD-10-CM | POA: Diagnosis not present

## 2023-02-28 DIAGNOSIS — K449 Diaphragmatic hernia without obstruction or gangrene: Secondary | ICD-10-CM | POA: Diagnosis not present

## 2023-02-28 DIAGNOSIS — I700 Atherosclerosis of aorta: Secondary | ICD-10-CM | POA: Diagnosis not present

## 2023-03-27 DIAGNOSIS — R161 Splenomegaly, not elsewhere classified: Secondary | ICD-10-CM | POA: Diagnosis not present

## 2023-03-27 DIAGNOSIS — K7030 Alcoholic cirrhosis of liver without ascites: Secondary | ICD-10-CM | POA: Diagnosis not present

## 2023-06-16 DIAGNOSIS — I350 Nonrheumatic aortic (valve) stenosis: Secondary | ICD-10-CM | POA: Diagnosis not present

## 2023-06-23 DIAGNOSIS — I252 Old myocardial infarction: Secondary | ICD-10-CM | POA: Diagnosis not present

## 2023-06-23 DIAGNOSIS — I4891 Unspecified atrial fibrillation: Secondary | ICD-10-CM | POA: Diagnosis not present

## 2023-06-23 DIAGNOSIS — E119 Type 2 diabetes mellitus without complications: Secondary | ICD-10-CM | POA: Diagnosis not present

## 2023-06-23 DIAGNOSIS — I2510 Atherosclerotic heart disease of native coronary artery without angina pectoris: Secondary | ICD-10-CM | POA: Diagnosis not present

## 2023-06-27 DIAGNOSIS — I10 Essential (primary) hypertension: Secondary | ICD-10-CM | POA: Diagnosis not present

## 2023-06-27 DIAGNOSIS — I2510 Atherosclerotic heart disease of native coronary artery without angina pectoris: Secondary | ICD-10-CM | POA: Diagnosis not present

## 2023-06-27 DIAGNOSIS — I350 Nonrheumatic aortic (valve) stenosis: Secondary | ICD-10-CM | POA: Diagnosis not present

## 2023-06-27 DIAGNOSIS — I480 Paroxysmal atrial fibrillation: Secondary | ICD-10-CM | POA: Diagnosis not present

## 2023-07-28 DIAGNOSIS — I480 Paroxysmal atrial fibrillation: Secondary | ICD-10-CM | POA: Diagnosis not present

## 2023-07-28 DIAGNOSIS — R001 Bradycardia, unspecified: Secondary | ICD-10-CM | POA: Diagnosis not present

## 2023-07-28 DIAGNOSIS — Z9861 Coronary angioplasty status: Secondary | ICD-10-CM | POA: Diagnosis not present

## 2023-07-28 DIAGNOSIS — R9431 Abnormal electrocardiogram [ECG] [EKG]: Secondary | ICD-10-CM | POA: Diagnosis not present

## 2023-07-28 DIAGNOSIS — Z5181 Encounter for therapeutic drug level monitoring: Secondary | ICD-10-CM | POA: Diagnosis not present

## 2023-09-08 DIAGNOSIS — Z1289 Encounter for screening for malignant neoplasm of other sites: Secondary | ICD-10-CM | POA: Diagnosis not present

## 2023-09-08 DIAGNOSIS — K7030 Alcoholic cirrhosis of liver without ascites: Secondary | ICD-10-CM | POA: Diagnosis not present

## 2023-09-08 DIAGNOSIS — I4891 Unspecified atrial fibrillation: Secondary | ICD-10-CM | POA: Diagnosis not present

## 2023-09-08 DIAGNOSIS — F17290 Nicotine dependence, other tobacco product, uncomplicated: Secondary | ICD-10-CM | POA: Diagnosis not present

## 2023-09-08 DIAGNOSIS — I10 Essential (primary) hypertension: Secondary | ICD-10-CM | POA: Diagnosis not present

## 2023-09-08 DIAGNOSIS — Z79899 Other long term (current) drug therapy: Secondary | ICD-10-CM | POA: Diagnosis not present

## 2023-09-08 DIAGNOSIS — Z7901 Long term (current) use of anticoagulants: Secondary | ICD-10-CM | POA: Diagnosis not present

## 2023-12-12 NOTE — Progress Notes (Signed)
 Ray Lewis                                          MRN: [id number]   12/12/2023   The [facility] Quality Team Specialist reviewed this patient medical record for the purposes of chart review for care gap closure. The following were reviewed: abstraction for care gap closure-controlling blood pressure.    [facility] Quality Team

## 2023-12-12 NOTE — Progress Notes (Signed)
 Ray Lewis                                          MRN: [id number]   12/12/2023   The [facility] Quality Team Specialist reviewed this patient medical record for the purposes of chart review for care gap closure. The following were reviewed: chart review for care gap closure-glycemic status assessment.    [facility] Quality Team

## 2024-01-05 NOTE — Progress Notes (Signed)
   01/05/2024  Patient ID: Ray Lewis, male   DOB: 02/06/1971, 53 y.o.   MRN: [id number]  Pharmacy Quality Measure Review  This patient is appearing on a report for being at risk of failing the Glycemic Status Assessment in Diabetes and Controlling Blood Pressure measure this calendar year.    Appears to only by followed by [facility] for primary care related need recent visit with Dr Dottie for at least a year. Misattribution noted.   Lang Sieve, PharmD, BCGP Clinical Pharmacist  [phone]

## 2024-04-02 NOTE — Progress Notes (Signed)
 Ray Lewis                                          MRN: [id number]   04/02/2024   The [facility] Quality Team Specialist reviewed this patient medical record for the purposes of chart review for care gap closure. The following were reviewed: chart review for care gap closure-glycemic status assessment.    [facility] Quality Team
